# Patient Record
Sex: Male | Born: 1985 | Hispanic: No | Marital: Married | Smoking: Never smoker
Health system: Southern US, Community
[De-identification: ages and names within clinical notes are randomized; demographics above are authoritative.]

---

## 2014-07-16 ENCOUNTER — Emergency Department (HOSPITAL_COMMUNITY): Payer: Medicaid Other

## 2014-07-16 ENCOUNTER — Encounter (HOSPITAL_COMMUNITY): Payer: Self-pay | Admitting: Emergency Medicine

## 2014-07-16 ENCOUNTER — Emergency Department (HOSPITAL_COMMUNITY)
Admission: EM | Admit: 2014-07-16 | Discharge: 2014-07-16 | Disposition: A | Discharge status: Home / Self Care | Payer: Medicaid Other | Attending: Emergency Medicine | Admitting: Emergency Medicine

## 2014-07-16 DIAGNOSIS — Y280XXA Contact with sharp glass, undetermined intent, initial encounter: Secondary | ICD-10-CM | POA: Diagnosis not present

## 2014-07-16 DIAGNOSIS — Y9289 Other specified places as the place of occurrence of the external cause: Secondary | ICD-10-CM | POA: Diagnosis not present

## 2014-07-16 DIAGNOSIS — Y9389 Activity, other specified: Secondary | ICD-10-CM | POA: Insufficient documentation

## 2014-07-16 DIAGNOSIS — S61011A Laceration without foreign body of right thumb without damage to nail, initial encounter: Secondary | ICD-10-CM

## 2014-07-16 MED ORDER — LIDOCAINE HCL 1 % IJ SOLN
10.0000 mL | Freq: Once | INTRAMUSCULAR | Status: DC
  Filled 2014-07-16: qty 10

## 2014-07-16 NOTE — ED Notes (Signed)
Pt. presents with laceration approx. 1" at right thumb sustained from a broken glass lighting fixture this evening with minimal bleeding .

## 2014-07-16 NOTE — ED Provider Notes (Signed)
CSN: [id number]     Arrival date & time 07/16/14  2126 History  This chart was scribed for non-physician practitioner, Irena Cords, PA-C working with Virgel Manifold, MD by Frederich Balding, ED scribe. This patient was seen in room TR05C/TR05C and the patient's care was started at 9:46 PM.    Chief Complaint  Patient presents with  . Laceration   The history is provided by the patient. No language interpreter was used.   HPI Comments: Joel Tyler is a 28 y.o. male who presents to the Emergency Department complaining of a laceration to his right thumb that occurred prior to arrival. States he was fixing a light fixture when a lightbulb broke in his hand. Reports pain around the area with palpation. He does not think any glass is in the wound. Pt is unsure of when his last tetanus was.   History reviewed. No pertinent past medical history. History reviewed. No pertinent past surgical history. History reviewed. No pertinent family history. History  Substance Use Topics  . Smoking status: Never Smoker   . Smokeless tobacco: Not on file  . Alcohol Use: No    Review of Systems All other systems negative except as documented in the HPI. All pertinent positives and negatives as reviewed in the HPI.  Allergies  Review of patient's allergies indicates no known allergies.  Home Medications   Prior to Admission medications   Not on File   BP 121/74  Pulse 79  Temp(Src) 98.3 F (36.8 C) (Oral)  Resp 14  Ht 5\' 8"  (1.727 m)  Wt 133 lb (60.328 kg)  BMI 20.23 kg/m2  SpO2 97%  Physical Exam  Nursing note and vitals reviewed. Constitutional: He is oriented to person, place, and time. He appears well-developed and well-nourished. No distress.  HENT:  Head: Normocephalic and atraumatic.  Cardiovascular: Normal rate.   Pulmonary/Chest: Effort normal. No respiratory distress.  Musculoskeletal: Normal range of motion.       Right hand: He exhibits laceration. He exhibits normal  two-point discrimination and no swelling. Normal sensation noted. Normal strength noted.  Neurological: He is alert and oriented to person, place, and time.  Skin: Skin is warm and dry.  Laceration to right thumb.  Psychiatric: He has a normal mood and affect. His behavior is normal.    ED Course  Procedures (including critical care time)  LACERATION REPAIR PROCEDURE NOTE The patient's identification was confirmed and consent was obtained. This procedure was performed by Delmer Islam, PA-S and Irena Cords, PA-C at 10:28 PM. Site: right thumb Sterile procedures observed Anesthetic used (type and amt): 2 mL 2% lidocaine without epi Suture type/size: 4-0 Prolene Length: 2 cm # of Sutures: 4 Technique: simple interrupted  Complexity: simple Antibx ointment applied  Tetanus ordered Site anesthetized, irrigated with NS, explored without evidence of foreign body, wound well approximated, site covered with dry, sterile dressing.  Patient tolerated procedure well without complications. Instructions for care discussed verbally and patient provided with additional written instructions for homecare and f/u.   DIAGNOSTIC STUDIES: Oxygen Saturation is 97% on RA, normal by my interpretation.    COORDINATION OF CARE: 9:47 PM-Discussed treatment plan which includes updating tetanus and laceration repair with pt at bedside and pt agreed to plan.   Imaging Review Dg Finger Thumb Right  07/16/2014   CLINICAL DATA:  Laceration involving the posterior aspect of the thumb across the IP joint. Concern for collapse radiopaque foreign body. Initial encounter.  EXAM: RIGHT THUMB 2+V  COMPARISON:  None.  FINDINGS: There is an apparent soft tissue laceration involving the dorsal soft tissues about the proximal phalanx of the thumb with scattered foci of subcutaneous emphysema. No displaced fracture or radiopaque foreign body. Joint spaces are preserved. No dislocation.  IMPRESSION: Apparent soft  tissue laceration about the dorsal soft tissues of the proximal phalanx of the thumb without associated fracture or radiopaque foreign body.   Electronically Signed   By: Sandi Mariscal M.D.   On: 07/16/2014 22:37     I personally performed the services described in this documentation, which was scribed in my presence. The recorded information has been reviewed and is accurate.    Brent General, PA-C 07/16/14 2320

## 2014-07-16 NOTE — Discharge Instructions (Signed)
Return here as needed. Have sutures out 14 days. Keep area clean and dry.

## 2014-07-16 NOTE — ED Notes (Signed)
Pt working on light at home. Broke glass and cut finger

## 2014-07-19 NOTE — ED Provider Notes (Signed)
Medical screening examination/treatment/procedure(s) were performed by non-physician practitioner and as supervising physician I was immediately available for consultation/collaboration.   EKG Interpretation None       Virgel Manifold, MD 07/19/14 1902

## 2014-12-16 ENCOUNTER — Emergency Department (HOSPITAL_COMMUNITY): Payer: Medicaid Other

## 2014-12-16 ENCOUNTER — Encounter (HOSPITAL_COMMUNITY): Payer: Self-pay | Admitting: Emergency Medicine

## 2014-12-16 ENCOUNTER — Emergency Department (HOSPITAL_COMMUNITY)
Admission: EM | Admit: 2014-12-16 | Discharge: 2014-12-16 | Disposition: A | Discharge status: Home / Self Care | Payer: Medicaid Other | Source: Home / Self Care

## 2014-12-16 ENCOUNTER — Inpatient Hospital Stay (HOSPITAL_COMMUNITY)
Admission: EM | Admit: 2014-12-16 | Discharge: 2014-12-20 | DRG: 395 | Disposition: A | Discharge status: Home / Self Care | Payer: Medicaid Other | Attending: Surgery | Admitting: Surgery

## 2014-12-16 DIAGNOSIS — D1803 Hemangioma of intra-abdominal structures: Secondary | ICD-10-CM | POA: Diagnosis present

## 2014-12-16 DIAGNOSIS — K8050 Calculus of bile duct without cholangitis or cholecystitis without obstruction: Secondary | ICD-10-CM

## 2014-12-16 DIAGNOSIS — K8020 Calculus of gallbladder without cholecystitis without obstruction: Secondary | ICD-10-CM | POA: Diagnosis present

## 2014-12-16 DIAGNOSIS — K550 Acute vascular disorders of intestine: Principal | ICD-10-CM | POA: Diagnosis present

## 2014-12-16 DIAGNOSIS — R1013 Epigastric pain: Secondary | ICD-10-CM | POA: Insufficient documentation

## 2014-12-16 DIAGNOSIS — K449 Diaphragmatic hernia without obstruction or gangrene: Secondary | ICD-10-CM | POA: Diagnosis present

## 2014-12-16 DIAGNOSIS — R1011 Right upper quadrant pain: Secondary | ICD-10-CM | POA: Insufficient documentation

## 2014-12-16 DIAGNOSIS — K654 Sclerosing mesenteritis: Secondary | ICD-10-CM | POA: Diagnosis present

## 2014-12-16 LAB — COMPREHENSIVE METABOLIC PANEL
ALBUMIN: 3.9 g/dL (ref 3.5–5.2)
ALT: 17 U/L (ref 0–53)
ALT: 16 U/L (ref 0–53)
AST: 21 U/L (ref 0–37)
AST: 20 U/L (ref 0–37)
Albumin: 3.9 g/dL (ref 3.5–5.2)
Alkaline Phosphatase: 69 U/L (ref 39–117)
Alkaline Phosphatase: 69 U/L (ref 39–117)
Anion gap: 4 — ABNORMAL LOW (ref 5–15)
Anion gap: 8 (ref 5–15)
BUN: 11 mg/dL (ref 6–23)
BUN: 14 mg/dL (ref 6–23)
CALCIUM: 8.8 mg/dL (ref 8.4–10.5)
CHLORIDE: 104 mmol/L (ref 96–112)
CO2: 30 mmol/L (ref 19–32)
CO2: 25 mmol/L (ref 19–32)
CREATININE: 0.75 mg/dL (ref 0.50–1.35)
Calcium: 9.2 mg/dL (ref 8.4–10.5)
Chloride: 104 mmol/L (ref 96–112)
Creatinine, Ser: 0.75 mg/dL (ref 0.50–1.35)
GFR calc Af Amer: >90 mL/min (ref >90)
GFR calc Af Amer: >90 mL/min (ref >90)
GFR calc non Af Amer: >90 mL/min (ref >90)
GFR calc non Af Amer: >90 mL/min (ref >90)
Glucose, Bld: 94 mg/dL (ref 70–99)
Glucose, Bld: 103 mg/dL — ABNORMAL HIGH (ref 70–99)
POTASSIUM: 3.8 mmol/L (ref 3.5–5.1)
Potassium: 4 mmol/L (ref 3.5–5.1)
SODIUM: 138 mmol/L (ref 135–145)
Sodium: 137 mmol/L (ref 135–145)
Total Bilirubin: 0.6 mg/dL (ref 0.3–1.2)
Total Bilirubin: 0.6 mg/dL (ref 0.3–1.2)
Total Protein: 7.1 g/dL (ref 6.0–8.3)
Total Protein: 7.1 g/dL (ref 6.0–8.3)

## 2014-12-16 LAB — CBC WITH DIFFERENTIAL/PLATELET
BASOS PCT: 0 % (ref 0–1)
Basophils Absolute: 0 10*3/uL (ref 0.0–0.1)
Basophils Absolute: 0 10*3/uL (ref 0.0–0.1)
Basophils Relative: 0 % (ref 0–1)
Eosinophils Absolute: 0.2 10*3/uL (ref 0.0–0.7)
Eosinophils Absolute: 0.1 10*3/uL (ref 0.0–0.7)
Eosinophils Relative: 3 % (ref 0–5)
Eosinophils Relative: 1 % (ref 0–5)
HCT: 42.3 % (ref 39.0–52.0)
HCT: 41.4 % (ref 39.0–52.0)
Hemoglobin: 14.9 g/dL (ref 13.0–17.0)
Hemoglobin: 14.6 g/dL (ref 13.0–17.0)
LYMPHS PCT: 39 % (ref 12–46)
Lymphocytes Relative: 18 % (ref 12–46)
Lymphs Abs: 1.9 10*3/uL (ref 0.7–4.0)
Lymphs Abs: 1.2 10*3/uL (ref 0.7–4.0)
MCH: 29.4 pg (ref 26.0–34.0)
MCH: 29.3 pg (ref 26.0–34.0)
MCHC: 35.2 g/dL (ref 30.0–36.0)
MCHC: 35.3 g/dL (ref 30.0–36.0)
MCV: 83.4 fL (ref 78.0–100.0)
MCV: 83.1 fL (ref 78.0–100.0)
MONO ABS: 0.4 10*3/uL (ref 0.1–1.0)
Monocytes Absolute: 0.3 10*3/uL (ref 0.1–1.0)
Monocytes Relative: 8 % (ref 3–12)
Monocytes Relative: 5 % (ref 3–12)
NEUTROS PCT: 50 % (ref 43–77)
Neutro Abs: 2.4 10*3/uL (ref 1.7–7.7)
Neutro Abs: 4.8 10*3/uL (ref 1.7–7.7)
Neutrophils Relative %: 76 % (ref 43–77)
Platelets: 192 10*3/uL (ref 150–400)
Platelets: 187 10*3/uL (ref 150–400)
RBC: 5.07 MIL/uL (ref 4.22–5.81)
RBC: 4.98 MIL/uL (ref 4.22–5.81)
RDW: 12.5 % (ref 11.5–15.5)
RDW: 12.5 % (ref 11.5–15.5)
WBC: 4.8 10*3/uL (ref 4.0–10.5)
WBC: 6.4 10*3/uL (ref 4.0–10.5)

## 2014-12-16 LAB — I-STAT CHEM 8, ED
BUN: 15 mg/dL (ref 6–23)
Calcium, Ion: 1.2 mmol/L (ref 1.12–1.23)
Chloride: 102 mmol/L (ref 96–112)
Creatinine, Ser: 0.8 mg/dL (ref 0.50–1.35)
Glucose, Bld: 98 mg/dL (ref 70–99)
HCT: 46 % (ref 39.0–52.0)
Hemoglobin: 15.6 g/dL (ref 13.0–17.0)
Potassium: 4 mmol/L (ref 3.5–5.1)
SODIUM: 140 mmol/L (ref 135–145)
TCO2: 23 mmol/L (ref 0–100)

## 2014-12-16 LAB — LIPASE, BLOOD
LIPASE: 30 U/L (ref 11–59)
Lipase: 23 U/L (ref 11–59)

## 2014-12-16 MED ORDER — FENTANYL CITRATE 0.05 MG/ML IJ SOLN
50.0000 ug | Freq: Once | INTRAMUSCULAR | Status: AC
  Administered 2014-12-16: 50 ug via INTRAVENOUS
  Filled 2014-12-16: qty 2

## 2014-12-16 MED ORDER — HYDROMORPHONE HCL 1 MG/ML IJ SOLN
1.0000 mg | Freq: Once | INTRAMUSCULAR | Status: AC
  Administered 2014-12-16: 1 mg via INTRAVENOUS
  Filled 2014-12-16: qty 1

## 2014-12-16 MED ORDER — OMEPRAZOLE 20 MG PO CPDR: 20.0000 mg | DELAYED_RELEASE_CAPSULE | Freq: Every day | ORAL | Status: AC

## 2014-12-16 MED ORDER — IOHEXOL 300 MG/ML  SOLN
25.0000 mL | Freq: Once | INTRAMUSCULAR | Status: AC | PRN
  Administered 2014-12-16: 25 mL via ORAL

## 2014-12-16 MED ORDER — ONDANSETRON HCL 4 MG/2ML IJ SOLN
4.0000 mg | Freq: Once | INTRAMUSCULAR | Status: AC
  Administered 2014-12-16: 4 mg via INTRAVENOUS
  Filled 2014-12-16: qty 2

## 2014-12-16 NOTE — ED Provider Notes (Signed)
CSN: [id number]     Arrival date & time 12/16/14  4818 History   First MD Initiated Contact with Patient 12/16/14 [phone]     Chief Complaint  Patient presents with  . Abdominal Pain     (Consider location/radiation/quality/duration/timing/severity/associated sxs/prior Treatment) Patient is a 29 y.o. male presenting with abdominal pain. The history is provided by the patient.  Abdominal Pain Associated symptoms: no chest pain, no diarrhea, no nausea, no shortness of breath and no vomiting    patient developed pain in his epigastric to right upper abdomen yesterday. It is constant. No change in eating. It is worse with palpation and deep breathing. No nausea vomiting. No diarrhea. No Constipation. He has not had pains like this before.. It is sharp. It does not radiate. States he does not drink alcohol. No recent NSAID use. No fevers. History reviewed. No pertinent past medical history. History reviewed. No pertinent past surgical history. No family history on file. History  Substance Use Topics  . Smoking status: Never Smoker   . Smokeless tobacco: Not on file  . Alcohol Use: No    Review of Systems  Constitutional: Negative for activity change and appetite change.  Eyes: Negative for pain.  Respiratory: Negative for chest tightness and shortness of breath.   Cardiovascular: Negative for chest pain and leg swelling.  Gastrointestinal: Positive for abdominal pain. Negative for nausea, vomiting and diarrhea.  Genitourinary: Negative for flank pain.  Musculoskeletal: Negative for back pain and neck stiffness.  Skin: Negative for rash.  Neurological: Negative for weakness, numbness and headaches.  Psychiatric/Behavioral: Negative for behavioral problems.      Allergies  Review of patient's allergies indicates no known allergies.  Home Medications   Prior to Admission medications   Not on File   BP 127/81 mmHg  Pulse 84  Temp(Src) 98.2 F (36.8 C) (Oral)  Resp 18  Wt 132  lb 4.4 oz (60 kg)  SpO2 100% Physical Exam  Constitutional: He is oriented to person, place, and time. He appears well-developed and well-nourished.  Patient is slender  HENT:  Head: Normocephalic and atraumatic.  Eyes: EOM are normal. Pupils are equal, round, and reactive to light.  Neck: Normal range of motion. Neck supple.  Cardiovascular: Normal rate, regular rhythm and normal heart sounds.   No murmur heard. Pulmonary/Chest: Effort normal and breath sounds normal.  Abdominal: Soft. Bowel sounds are normal. He exhibits no distension and no mass. There is tenderness. There is no rebound and no guarding.  Tenderness to epigastric to right upper quadrant area. No rebound or guarding.  Musculoskeletal: Normal range of motion. He exhibits no edema.  Neurological: He is alert and oriented to person, place, and time. No cranial nerve deficit.  Skin: Skin is warm and dry.  Psychiatric: He has a normal mood and affect.  Nursing note and vitals reviewed.   ED Course  Procedures (including critical care time) Labs Review Labs Reviewed  COMPREHENSIVE METABOLIC PANEL  LIPASE, BLOOD  CBC WITH DIFFERENTIAL/PLATELET    Imaging Review No results found.   EKG Interpretation None      MDM   Final diagnoses:  None    Patient with epigastric abdominal pain. Tenderness is slightly more medial than gallbladder. Lab work is overall reassuring. Ultrasound showed possible sludge in the gallbladder neck without other signs cholecystitis. I'll treat with Prilosec and will have follow-up with general surgery as needed. Doubt acute cholecystitis at this time. Has been given instructions on what to watch out for  for returning to the ER.    Davonna Belling, MD 12/16/14 1052

## 2014-12-16 NOTE — ED Notes (Signed)
Patient transported to Ultrasound 

## 2014-12-16 NOTE — Discharge Instructions (Signed)
Biliary Colic  °Biliary colic is a steady or irregular pain in the upper abdomen. It is usually under the right side of the rib cage. It happens when gallstones interfere with the normal flow of bile from the gallbladder. Bile is a liquid that helps to digest fats. Bile is made in the liver and stored in the gallbladder. When you eat a meal, bile passes from the gallbladder through the cystic duct and the common bile duct into the small intestine. There, it mixes with partially digested food. If a gallstone blocks either of these ducts, the normal flow of bile is blocked. The muscle cells in the bile duct contract forcefully to try to move the stone. This causes the pain of biliary colic.  °SYMPTOMS  °· A person with biliary colic usually complains of pain in the upper abdomen. This pain can be: °· In the center of the upper abdomen just below the breastbone. °· In the upper-right part of the abdomen, near the gallbladder and liver. °· Spread back toward the right shoulder blade. °· Nausea and vomiting. °· The pain usually occurs after eating. °· Biliary colic is usually triggered by the digestive system's demand for bile. The demand for bile is high after fatty meals. Symptoms can also occur when a person who has been fasting suddenly eats a very large meal. Most episodes of biliary colic pass after 1 to 5 hours. After the most intense pain passes, your abdomen may continue to ache mildly for about 24 hours. °DIAGNOSIS  °After you describe your symptoms, your caregiver will perform a physical exam. He or she will pay attention to the upper right portion of your belly (abdomen). This is the area of your liver and gallbladder. An ultrasound will help your caregiver look for gallstones. Specialized scans of the gallbladder may also be done. Blood tests may be done, especially if you have fever or if your pain persists. °PREVENTION  °Biliary colic can be prevented by controlling the risk factors for gallstones. Some of  these risk factors, such as heredity, increasing age, and pregnancy are a normal part of life. Obesity and a high-fat diet are risk factors you can change through a healthy lifestyle. Women going through menopause who take hormone replacement therapy (estrogen) are also more likely to develop biliary colic. °TREATMENT  °· Pain medication may be prescribed. °· You may be encouraged to eat a fat-free diet. °· If the first episode of biliary colic is severe, or episodes of colic keep retuning, surgery to remove the gallbladder (cholecystectomy) is usually recommended. This procedure can be done through small incisions using an instrument called a laparoscope. The procedure often requires a brief stay in the hospital. Some people can leave the hospital the same day. It is the most widely used treatment in people troubled by painful gallstones. It is effective and safe, with no complications in more than 90% of cases. °· If surgery cannot be done, medication that dissolves gallstones may be used. This medication is expensive and can take months or years to work. Only small stones will dissolve. °· Rarely, medication to dissolve gallstones is combined with a procedure called shock-wave lithotripsy. This procedure uses carefully aimed shock waves to break up gallstones. In many people treated with this procedure, gallstones form again within a few years. °PROGNOSIS  °If gallstones block your cystic duct or common bile duct, you are at risk for repeated episodes of biliary colic. There is also a 25% chance that you will develop   a gallbladder infection(acute cholecystitis), or some other complication of gallstones within 10 to 20 years. If you have surgery, schedule it at a time that is convenient for you and at a time when you are not sick. HOME CARE INSTRUCTIONS   Drink plenty of clear fluids.  Avoid fatty, greasy or fried foods, or any foods that make your pain worse.  Take medications as directed. SEEK MEDICAL  CARE IF:   You develop a fever over 100.5 F (38.1 C).  Your pain gets worse over time.  You develop nausea that prevents you from eating and drinking.  You develop vomiting. SEEK IMMEDIATE MEDICAL CARE IF:   You have continuous or severe belly (abdominal) pain which is not relieved with medications.  You develop nausea and vomiting which is not relieved with medications.  You have symptoms of biliary colic and you suddenly develop a fever and shaking chills. This may signal cholecystitis. Call your caregiver immediately.  You develop a yellow color to your skin or the white part of your eyes (jaundice). Document Released: 02/23/2006 Document Revised: 12/15/2011 Document Reviewed: 05/04/2008 [facility] Patient Information 2015 [place], [place]. This information is not intended to replace advice given to you by your health care provider. Make sure you discuss any questions you have with your health care provider.  Abdominal Pain Many things can cause abdominal pain. Usually, abdominal pain is not caused by a disease and will improve without treatment. It can often be observed and treated at home. Your health care provider will do a physical exam and possibly order blood tests and X-rays to help determine the seriousness of your pain. However, in many cases, more time must pass before a clear cause of the pain can be found. Before that point, your health care provider may not know if you need more testing or further treatment. HOME CARE INSTRUCTIONS  Monitor your abdominal pain for any changes. The following actions may help to alleviate any discomfort you are experiencing:  Only take over-the-counter or prescription medicines as directed by your health care provider.  Do not take laxatives unless directed to do so by your health care provider.  Try a clear liquid diet (broth, tea, or water) as directed by your health care provider. Slowly move to a bland diet as tolerated. SEEK MEDICAL  CARE IF:  You have unexplained abdominal pain.  You have abdominal pain associated with nausea or diarrhea.  You have pain when you urinate or have a bowel movement.  You experience abdominal pain that wakes you in the night.  You have abdominal pain that is worsened or improved by eating food.  You have abdominal pain that is worsened with eating fatty foods.  You have a fever. SEEK IMMEDIATE MEDICAL CARE IF:   Your pain does not go away within 2 hours.  You keep throwing up (vomiting).  Your pain is felt only in portions of the abdomen, such as the right side or the left lower portion of the abdomen.  You pass bloody or black tarry stools. MAKE SURE YOU:  Understand these instructions.   Will watch your condition.   Will get help right away if you are not doing well or get worse.  Document Released: 07/02/2005 Document Revised: 09/27/2013 Document Reviewed: 06/01/2013 [facility] Patient Information 2015 [place], [place]. This information is not intended to replace advice given to you by your health care provider. Make sure you discuss any questions you have with your health care provider.

## 2014-12-16 NOTE — ED Provider Notes (Signed)
CSN: [id number]     Arrival date & time 12/16/14  1628 History   First MD Initiated Contact with Patient 12/16/14 2100     Chief Complaint  Patient presents with  . Abdominal Pain     (Consider location/radiation/quality/duration/timing/severity/associated sxs/prior Treatment) HPI   The patient is a 29 y/o [place] man who presents to the emergency department for right-sided abdominal pain after being evaluated here this morning. He states that after his work-up this morning, he was discharged with a prescription for prilosec. He took this with no relief. He states that since then, his pain has significantly worsened, becoming a constant 8/10 in severity instead of intermittently as it was this morning. The pain is located in the midline to RUQ and also radiates downward and to his back. He notes no change with eating and was able to tolerate lunch today. It is worsened by deep breathing and movement. He denies fevers, chills, nausea, vomiting, diarrhea, chest pain, numbness, tingling. He has developed a headache since this morning. He says it is mild and dull, located in the front of his head. He denies photophobia, phonophobia, and sinus congestion. He denies alcohol and tobacco use.   His lab work this morning included a CBC, lipase, and CMP all WNL. A gallbladder ultrasound showed cholelithiasis without acute cholecystitis and 2 possible benign hemangiomas of the liver.    History reviewed. No pertinent past medical history. History reviewed. No pertinent past surgical history. No family history on file. History  Substance Use Topics  . Smoking status: Never Smoker   . Smokeless tobacco: Not on file  . Alcohol Use: No    Review of Systems  All other systems negative except as documented in the HPI. All pertinent positives and negatives as reviewed in the HPI.   Allergies  Review of patient's allergies indicates no known allergies.  Home Medications   Prior to Admission  medications   Medication Sig Start Date End Date Taking? Authorizing Provider  omeprazole (PRILOSEC) 20 MG capsule Take 1 capsule (20 mg total) by mouth daily. 12/16/14   Davonna Belling, MD   BP 118/75 mmHg  Pulse 83  Temp(Src) 99 F (37.2 C) (Oral)  Resp 16  Ht 5\' 8"  (1.727 m)  Wt 135 lb (61.236 kg)  BMI 20.53 kg/m2  SpO2 97% Physical Exam  Constitutional: He is oriented to person, place, and time. He appears well-nourished. He appears distressed.  HENT:  Head: Normocephalic and atraumatic.  Eyes: EOM are normal. Pupils are equal, round, and reactive to light.  Neck: Normal range of motion. Neck supple.  Cardiovascular: Normal rate, regular rhythm, normal heart sounds and intact distal pulses.  Exam reveals no gallop and no friction rub.   No murmur heard. Pulmonary/Chest: Effort normal and breath sounds normal.  Difficulty taking a deep breath due to abdominal pain  Abdominal: Normal appearance. He exhibits no distension. There is tenderness in the right upper quadrant, right lower quadrant and epigastric area. There is guarding. There is no rebound.  Musculoskeletal: He exhibits no edema.  Lymphadenopathy:    He has no cervical adenopathy.  Neurological: He is alert and oriented to person, place, and time.  Skin: Skin is warm and dry.  Psychiatric: He has a normal mood and affect.  Nursing note and vitals reviewed.   ED Course  Procedures (including critical care time) Labs Review Labs Reviewed  I-STAT CHEM 8, ED    Imaging Review US Abdomen Complete  12/16/2014   CLINICAL DATA:  29 year old male with a history of abdominal pain for 1 day  EXAM: ULTRASOUND ABDOMEN COMPLETE  COMPARISON:  None.  FINDINGS: Gallbladder: No evidence of gallbladder wall thickening. Negative sonographic Murphy's sign. Heterogeneously echoic material within the neck of the gallbladder with posterior acoustic shadowing.  Common bile duct: Diameter: 2.4 mm  Liver: Relatively homogeneous appearance  of liver parenchyma. Focal uniformly hyperechoic lesions identified.  Left lobe:  1.9 cm x 1.4 cm x 1.9 cm, segment 4B.  Left lobe:  1.5 cm x 1.1 cm x 1.2 cm, segment 4B.  IVC: No abnormality visualized.  Pancreas: Visualized portion unremarkable.  Spleen: Size and appearance within normal limits.  Right Kidney: Length: 9.6 cm. Echogenicity within normal limits. No mass or hydronephrosis visualized.  Left Kidney: Length: 9.3 cm. Echogenicity within normal limits. No mass or hydronephrosis visualized.  Abdominal aorta: No aneurysm visualized.  Other findings: None.  IMPRESSION: Evidence of cholelithiasis without sonographic evidence of acute cholecystitis.  There are 2 separate small hyperechoic lesions of the liver. In a patient of this age without risk factors for carcinoma, most likely entity would be benign hemangioma. If the patient has risk factors for development of cirrhosis/HCC, or otherwise a known primary, further evaluation with MRI would be indicated.  Signed,  Dulcy Fanny. Earleen Newport, DO  [facility]  [facility]   Electronically Signed   By: Corrie Mckusick D.O.   On: 12/16/2014 09:46   I spoke with general surgery  Dr. Redmond Pulling about the patient.  He will come in to evaluate him for continued significant pain and guarding in the right upper quadrant region.  This was on reexamination after pain medications and CT scan.  The patient's significant abdominal discomfort, does not correlate with his laboratory testing and CT scan the pain medication did not seem to help significantly   MDM   Final diagnoses:  None       Dalia Heading, PA-C 12/17/14 0148  Alfonzo Beers, MD 12/17/14 [phone]

## 2014-12-16 NOTE — ED Notes (Signed)
Pt c/o right lower quadrant abdominal pain onset yesterday. Pt seen here earlier for same, but returned due to pain. Pt tried prescribed medication without relief.

## 2014-12-16 NOTE — ED Notes (Signed)
Pt reports sudden onset pain in LUQ radiating to back, constant 8/10 pain.  Denies N/V/D, denies SOB.  Resp e/u, NAD noted at this time.

## 2014-12-16 NOTE — ED Notes (Signed)
CT notified that pt finished his contrast.

## 2014-12-17 ENCOUNTER — Emergency Department (HOSPITAL_COMMUNITY): Payer: Medicaid Other

## 2014-12-17 ENCOUNTER — Encounter (HOSPITAL_COMMUNITY): Payer: Self-pay | Admitting: Radiology

## 2014-12-17 ENCOUNTER — Inpatient Hospital Stay (HOSPITAL_COMMUNITY): Payer: Medicaid Other

## 2014-12-17 DIAGNOSIS — K8020 Calculus of gallbladder without cholecystitis without obstruction: Secondary | ICD-10-CM | POA: Diagnosis present

## 2014-12-17 DIAGNOSIS — R1013 Epigastric pain: Secondary | ICD-10-CM

## 2014-12-17 DIAGNOSIS — R1011 Right upper quadrant pain: Secondary | ICD-10-CM | POA: Diagnosis present

## 2014-12-17 DIAGNOSIS — K550 Acute vascular disorders of intestine: Secondary | ICD-10-CM | POA: Diagnosis present

## 2014-12-17 DIAGNOSIS — K449 Diaphragmatic hernia without obstruction or gangrene: Secondary | ICD-10-CM | POA: Diagnosis present

## 2014-12-17 DIAGNOSIS — D1803 Hemangioma of intra-abdominal structures: Secondary | ICD-10-CM | POA: Diagnosis present

## 2014-12-17 LAB — I-STAT CG4 LACTIC ACID, ED: Lactic Acid, Venous: 0.43 mmol/L — ABNORMAL LOW (ref 0.5–2.0)

## 2014-12-17 MED ORDER — HYDROMORPHONE HCL 1 MG/ML IJ SOLN
1.0000 mg | Freq: Once | INTRAMUSCULAR | Status: AC
  Administered 2014-12-17: 1 mg via INTRAVENOUS
  Filled 2014-12-17: qty 1

## 2014-12-17 MED ORDER — HEPARIN SODIUM (PORCINE) 5000 UNIT/ML IJ SOLN
5000.0000 [IU] | Freq: Three times a day (TID) | INTRAMUSCULAR | Status: DC
  Administered 2014-12-17 – 2014-12-20 (×7): 5000 [IU] via SUBCUTANEOUS
  Filled 2014-12-17 (×7): qty 1

## 2014-12-17 MED ORDER — SINCALIDE 5 MCG IJ SOLR
0.0200 ug/kg | Freq: Once | INTRAMUSCULAR | Status: AC
  Administered 2014-12-17: 1.2 ug via INTRAVENOUS

## 2014-12-17 MED ORDER — IOHEXOL 300 MG/ML  SOLN
100.0000 mL | Freq: Once | INTRAMUSCULAR | Status: AC | PRN
  Administered 2014-12-17: 100 mL via INTRAVENOUS

## 2014-12-17 MED ORDER — ONDANSETRON HCL 4 MG PO TABS: 4.0000 mg | ORAL_TABLET | Freq: Four times a day (QID) | ORAL | Status: DC | PRN

## 2014-12-17 MED ORDER — SINCALIDE 5 MCG IJ SOLR
INTRAMUSCULAR | Status: AC
  Filled 2014-12-17: qty 5

## 2014-12-17 MED ORDER — ONDANSETRON HCL 4 MG/2ML IJ SOLN
4.0000 mg | Freq: Once | INTRAMUSCULAR | Status: AC
  Administered 2014-12-17: 4 mg via INTRAVENOUS
  Filled 2014-12-17: qty 2

## 2014-12-17 MED ORDER — SODIUM CHLORIDE 0.9 % IV BOLUS (SEPSIS)
1000.0000 mL | Freq: Once | INTRAVENOUS | Status: AC
  Administered 2014-12-17: 1000 mL via INTRAVENOUS

## 2014-12-17 MED ORDER — TECHNETIUM TC 99M MEBROFENIN IV KIT
5.0000 | PACK | Freq: Once | INTRAVENOUS | Status: AC | PRN
  Administered 2014-12-17: 5 via INTRAVENOUS

## 2014-12-17 MED ORDER — ONDANSETRON HCL 4 MG/2ML IJ SOLN: 4.0000 mg | Freq: Four times a day (QID) | INTRAMUSCULAR | Status: DC | PRN

## 2014-12-17 MED ORDER — PANTOPRAZOLE SODIUM 40 MG PO TBEC
40.0000 mg | DELAYED_RELEASE_TABLET | Freq: Every day | ORAL | Status: DC
  Administered 2014-12-17 – 2014-12-20 (×3): 40 mg via ORAL
  Filled 2014-12-17 (×3): qty 1

## 2014-12-17 MED ORDER — HYDROMORPHONE HCL 1 MG/ML IJ SOLN
1.0000 mg | INTRAMUSCULAR | Status: DC | PRN
  Administered 2014-12-17 – 2014-12-18 (×5): 1 mg via INTRAVENOUS
  Filled 2014-12-17 (×6): qty 1

## 2014-12-17 MED ORDER — STERILE WATER FOR INJECTION IJ SOLN
INTRAMUSCULAR | Status: AC
  Filled 2014-12-17: qty 10

## 2014-12-17 NOTE — Progress Notes (Signed)
PATIENT DETAILS Name: Joel Tyler Age: 29 y.o. Sex: male Date of Birth: Aug 14, 1986 Admit Date: 12/16/2014 Admitting Physician Etta Quill, DO PCP:No PCP Per Patient  Subjective: Still with RUQ pain  Assessment/Plan: Active Problems:   Right upper quadrant abdominal pain: Admitted with persistent right upper quadrant abdominal pain. Although CT abdomen/ultrasound abdomen shows cholelithiasis, HIDA scan negative. Continue supportive measures, will consult gastroenterology for EGD. Start clear liquids and advance as tolerated.  Disposition: Remain inpatient  Antibiotics:  None   Anti-infectives    None      DVT Prophylaxis: Prophylactic Heparin   Code Status: Full code  Family Communication Brother at bedside  Procedures:  None  CONSULTS:  GI and general surgery  MEDICATIONS: Scheduled Meds: . heparin  5,000 Units Subcutaneous 3 times per day  . sincalide      . sterile water (preservative free)       Continuous Infusions:  PRN Meds:.HYDROmorphone (DILAUDID) injection, ondansetron **OR** ondansetron (ZOFRAN) IV    PHYSICAL EXAM: Vital signs in last 24 hours: Filed Vitals:   12/17/14 0400 12/17/14 0415 12/17/14 0430 12/17/14 0613  BP: 111/61 111/61 118/67 116/74  Pulse: 67 80 70 70  Temp:    98.6 F (37 C)  TempSrc:    Oral  Resp: 11 16 16 16   Height:      Weight:    60.2 kg (132 lb 11.5 oz)  SpO2: 99% 100% 98% 100%    Weight change:  Filed Weights   12/16/14 1710 12/17/14 0613  Weight: 61.236 kg (135 lb) 60.2 kg (132 lb 11.5 oz)   Body mass index is 20.18 kg/(m^2).   Gen Exam: Awake and alert with clear speech.   Neck: Supple, No JVD.   Chest: B/L Clear.   CVS: S1 S2 Regular, no murmurs.  Abdomen: soft, BS +, RUQ tender, non distended.  Extremities: no edema, lower extremities warm to touch. Neurologic: Non Focal.   Skin: No Rash.   Wounds: N/A.   Intake/Output from previous day:  Intake/Output Summary (Last 24 hours)  at 12/17/14 1238 Last data filed at 12/17/14 0800  Gross per 24 hour  Intake      0 ml  Output      0 ml  Net      0 ml     LAB RESULTS: CBC  Recent Labs Lab 12/16/14 0718 12/16/14 1719 12/16/14 2130  WBC 4.8  --  6.4  HGB 14.9 15.6 14.6  HCT 42.3 46.0 41.4  PLT 192  --  187  MCV 83.4  --  83.1  MCH 29.4  --  29.3  MCHC 35.2  --  35.3  RDW 12.5  --  12.5  LYMPHSABS 1.9  --  1.2  MONOABS 0.4  --  0.3  EOSABS 0.2  --  0.1  BASOSABS 0.0  --  0.0    Chemistries   Recent Labs Lab 12/16/14 0718 12/16/14 1719 12/16/14 2130  NA 138 140 137  K 3.8 4.0 4.0  CL 104 102 104  CO2 30  --  25  GLUCOSE 94 98 103*  BUN 11 15 14   CREATININE 0.75 0.80 0.75  CALCIUM 8.8  --  9.2    CBG: No results for input(s): GLUCAP in the last 168 hours.  GFR Estimated Creatinine Clearance: 116 mL/min (by C-G formula based on Cr of 0.75).  Coagulation profile No results for input(s): INR, PROTIME in the last 168 hours.  Cardiac Enzymes No results for input(s): CKMB, TROPONINI, MYOGLOBIN in the last 168 hours.  Invalid input(s): CK  Invalid input(s): POCBNP No results for input(s): DDIMER in the last 72 hours. No results for input(s): HGBA1C in the last 72 hours. No results for input(s): CHOL, HDL, LDLCALC, TRIG, CHOLHDL, LDLDIRECT in the last 72 hours. No results for input(s): TSH, T4TOTAL, T3FREE, THYROIDAB in the last 72 hours.  Invalid input(s): FREET3 No results for input(s): VITAMINB12, FOLATE, FERRITIN, TIBC, IRON, RETICCTPCT in the last 72 hours.  Recent Labs  12/16/14 0718 12/16/14 2130  LIPASE 30 23    Urine Studies No results for input(s): UHGB, CRYS in the last 72 hours.  Invalid input(s): UACOL, UAPR, USPG, UPH, UTP, UGL, UKET, UBIL, UNIT, UROB, ULEU, UEPI, UWBC, URBC, UBAC, CAST, UCOM, BILUA  MICROBIOLOGY: No results found for this or any previous visit (from the past 240 hour(s)).  RADIOLOGY STUDIES/RESULTS: Nm Hepatobiliary Liver Func  12/17/2014    CLINICAL DATA:  29 year old male with history of abdominal pain and cholelithiasis noted on recent CT examination.  EXAM: NUCLEAR MEDICINE HEPATOBILIARY IMAGING WITH GALLBLADDER EF  TECHNIQUE: Sequential images of the abdomen were obtained out to 60 minutes following intravenous administration of radiopharmaceutical. After slow intravenous infusion of 1.2 micrograms Cholecystokinin, gallbladder ejection fraction was determined.  RADIOPHARMACEUTICALS:  5 [employer] [id number] Choletec  COMPARISON:  No priors.  FINDINGS: Rapid uptake of radiotracer throughout the hepatic parenchyma. Gallbladder activity was noted by 10 min. At 60 min, most of the activity was within the gallbladder. Following administration of cholecystokinin, activity rapidly entered the small bowel. Gallbladder ejection fraction was 68%. At 45 min, normal ejection fraction is greater than 40%.  IMPRESSION: 1. Normal study.  No findings to suggest acute cholecystitis.   Electronically Signed   By: Vinnie Langton M.D.   On: 12/17/2014 11:45   US Abdomen Complete  12/16/2014   CLINICAL DATA:  29 year old male with a history of abdominal pain for 1 day  EXAM: ULTRASOUND ABDOMEN COMPLETE  COMPARISON:  None.  FINDINGS: Gallbladder: No evidence of gallbladder wall thickening. Negative sonographic Murphy's sign. Heterogeneously echoic material within the neck of the gallbladder with posterior acoustic shadowing.  Common bile duct: Diameter: 2.4 mm  Liver: Relatively homogeneous appearance of liver parenchyma. Focal uniformly hyperechoic lesions identified.  Left lobe:  1.9 cm x 1.4 cm x 1.9 cm, segment 4B.  Left lobe:  1.5 cm x 1.1 cm x 1.2 cm, segment 4B.  IVC: No abnormality visualized.  Pancreas: Visualized portion unremarkable.  Spleen: Size and appearance within normal limits.  Right Kidney: Length: 9.6 cm. Echogenicity within normal limits. No mass or hydronephrosis visualized.  Left Kidney: Length: 9.3 cm. Echogenicity within normal limits. No mass  or hydronephrosis visualized.  Abdominal aorta: No aneurysm visualized.  Other findings: None.  IMPRESSION: Evidence of cholelithiasis without sonographic evidence of acute cholecystitis.  There are 2 separate small hyperechoic lesions of the liver. In a patient of this age without risk factors for carcinoma, most likely entity would be benign hemangioma. If the patient has risk factors for development of cirrhosis/HCC, or otherwise a known primary, further evaluation with MRI would be indicated.  Signed,  Dulcy Fanny. Earleen Newport, DO  [facility]  [facility] [address] [facility]   Electronically Signed   By: Corrie Mckusick D.O.   On: 12/16/2014 09:46   Ct Abdomen Pelvis W Contrast  12/17/2014   CLINICAL DATA:  Right mid and lower quadrant abdominal pain.  EXAM: CT ABDOMEN  AND PELVIS WITH CONTRAST  TECHNIQUE: Multidetector CT imaging of the abdomen and pelvis was performed using the standard protocol following bolus administration of intravenous contrast.  CONTRAST:  163mL OMNIPAQUE IOHEXOL 300 MG/ML  SOLN  COMPARISON:  Right upper quadrant ultrasound performed yesterday.  FINDINGS: The included lung bases are clear.  Two hypodense lesions in the left hepatic lobe measure 1.6 and 1.0 cm with some peripheral puddling of contrast, consistent with hemangiomas previous correspond to the hyperechoic lesion on ultrasound. Gallbladder is physiologically distended, gallstone tentatively identified in the gallbladder neck. No pericholecystic fluid or wall thickening.  The appendix is normal.  The spleen, pancreas, adrenal glands, and kidneys are normal.  Stomach is distended with ingested contrast. There are no dilated or thickened bowel loops. Small volume of stool throughout the colon. No signs of bowel inflammation. No free air, free fluid, or intra-abdominal fluid collection.  Abdominal aorta is normal in caliber. No retroperitoneal or mesenteric adenopathy.  Within the pelvis the bladder is  physiologically distended. Prostate gland is normal in size. No inguinal hernia. Scattered bone islands are noted.  No acute or suspicious osseous abnormalities.  IMPRESSION: 1. No acute abnormality in the abdomen/pelvis. 2. Two lesions in the left lobe of the liver, largest measuring 1.6 cm, consistent with hemangiomas, corresponding to that on ultrasound. 3. Cholelithiasis without CT findings of cholecystitis.   Electronically Signed   By: Jeb Levering M.D.   On: 12/17/2014 00:35    Oren Binet, MD  [facility] Pager:[phone]  If 7PM-7AM, please contact night-coverage [email or web address] Password [facility] 12/17/2014, 12:38 PM   LOS: 0 days

## 2014-12-17 NOTE — Consult Note (Signed)
Reason for Consult:abd pain  Referring Physician: Dr Irwin Brakeman is an 29 y.o. male.  HPI: 29 yo male developed right sided abd pain yesterday and came to the ED early on 3/12  for evaluation. Labs were normal. U/s showed possible gallstone in neck without cholecystitis. Given rx for prilosec and sent out from ED sat morning. Went home and was able to eat lunch which didn't affect pain. Pain came back once pain meds from ED wore off. Pain constant, right sided. Doesn't radiate. Had n/v in the ED. No prior symptoms until yesterday. No d/c/melena/hematochezia/dysuria/wt loss/abx use/foreign travel. Had CT tonight which showed Gallstone. Repeat labs are normal. No f/c. Pain is sharp.   Denies PMHx, etoh, NSAIDs  History reviewed. No pertinent past medical history.  History reviewed. No pertinent past surgical history.  No family history on file.  Social History:  reports that he has never smoked. He does not have any smokeless tobacco history on file. He reports that he does not drink alcohol or use illicit drugs.  Allergies: No Known Allergies  Medications: I have reviewed the patient's current medications.  Results for orders placed or performed during the hospital encounter of 12/16/14 (from the past 48 hour(s))  I-Stat Chem 8, ED     Status: None   Collection Time: 12/16/14  5:19 PM  Result Value Ref Range   Sodium 140 135 - 145 mmol/L   Potassium 4.0 3.5 - 5.1 mmol/L   Chloride 102 96 - 112 mmol/L   BUN 15 6 - 23 mg/dL   Creatinine, Ser 0.80 0.50 - 1.35 mg/dL   Glucose, Bld 98 70 - 99 mg/dL   Calcium, Ion 1.20 1.12 - 1.23 mmol/L   TCO2 23 0 - 100 mmol/L   Hemoglobin 15.6 13.0 - 17.0 g/dL   HCT 46.0 39.0 - 52.0 %  Comprehensive metabolic panel     Status: Abnormal   Collection Time: 12/16/14  9:30 PM  Result Value Ref Range   Sodium 137 135 - 145 mmol/L   Potassium 4.0 3.5 - 5.1 mmol/L   Chloride 104 96 - 112 mmol/L   CO2 25 19 - 32 mmol/L   Glucose, Bld 103 (H) 70  - 99 mg/dL   BUN 14 6 - 23 mg/dL   Creatinine, Ser 0.75 0.50 - 1.35 mg/dL   Calcium 9.2 8.4 - 10.5 mg/dL   Total Protein 7.1 6.0 - 8.3 g/dL   Albumin 3.9 3.5 - 5.2 g/dL   AST 20 0 - 37 U/L   ALT 16 0 - 53 U/L   Alkaline Phosphatase 69 39 - 117 U/L   Total Bilirubin 0.6 0.3 - 1.2 mg/dL   GFR calc non Af Amer >90 >90 mL/min   GFR calc Af Amer >90 >90 mL/min    Comment: (NOTE) The eGFR has been calculated using the CKD EPI equation. This calculation has not been validated in all clinical situations. eGFR's persistently <90 mL/min signify possible Chronic Kidney Disease.    Anion gap 8 5 - 15  Lipase, blood     Status: None   Collection Time: 12/16/14  9:30 PM  Result Value Ref Range   Lipase 23 11 - 59 U/L  CBC with Differential     Status: None   Collection Time: 12/16/14  9:30 PM  Result Value Ref Range   WBC 6.4 4.0 - 10.5 K/uL   RBC 4.98 4.22 - 5.81 MIL/uL   Hemoglobin 14.6 13.0 - 17.0 g/dL  HCT 41.4 39.0 - 52.0 %   MCV 83.1 78.0 - 100.0 fL   MCH 29.3 26.0 - 34.0 pg   MCHC 35.3 30.0 - 36.0 g/dL   RDW 12.5 11.5 - 15.5 %   Platelets 187 150 - 400 K/uL   Neutrophils Relative % 76 43 - 77 %   Neutro Abs 4.8 1.7 - 7.7 K/uL   Lymphocytes Relative 18 12 - 46 %   Lymphs Abs 1.2 0.7 - 4.0 K/uL   Monocytes Relative 5 3 - 12 %   Monocytes Absolute 0.3 0.1 - 1.0 K/uL   Eosinophils Relative 1 0 - 5 %   Eosinophils Absolute 0.1 0.0 - 0.7 K/uL   Basophils Relative 0 0 - 1 %   Basophils Absolute 0.0 0.0 - 0.1 K/uL  I-Stat CG4 Lactic Acid, ED     Status: Abnormal   Collection Time: 12/17/14  1:17 AM  Result Value Ref Range   Lactic Acid, Venous 0.43 (L) 0.5 - 2.0 mmol/L    US Abdomen Complete  12/16/2014   CLINICAL DATA:  29 year old male with a history of abdominal pain for 1 day  EXAM: ULTRASOUND ABDOMEN COMPLETE  COMPARISON:  None.  FINDINGS: Gallbladder: No evidence of gallbladder wall thickening. Negative sonographic Murphy's sign. Heterogeneously echoic material within the  neck of the gallbladder with posterior acoustic shadowing.  Common bile duct: Diameter: 2.4 mm  Liver: Relatively homogeneous appearance of liver parenchyma. Focal uniformly hyperechoic lesions identified.  Left lobe:  1.9 cm x 1.4 cm x 1.9 cm, segment 4B.  Left lobe:  1.5 cm x 1.1 cm x 1.2 cm, segment 4B.  IVC: No abnormality visualized.  Pancreas: Visualized portion unremarkable.  Spleen: Size and appearance within normal limits.  Right Kidney: Length: 9.6 cm. Echogenicity within normal limits. No mass or hydronephrosis visualized.  Left Kidney: Length: 9.3 cm. Echogenicity within normal limits. No mass or hydronephrosis visualized.  Abdominal aorta: No aneurysm visualized.  Other findings: None.  IMPRESSION: Evidence of cholelithiasis without sonographic evidence of acute cholecystitis.  There are 2 separate small hyperechoic lesions of the liver. In a patient of this age without risk factors for carcinoma, most likely entity would be benign hemangioma. If the patient has risk factors for development of cirrhosis/HCC, or otherwise a known primary, further evaluation with MRI would be indicated.  Signed,  Dulcy Fanny. Earleen Newport, DO  [facility]  [facility]   Electronically Signed   By: Corrie Mckusick D.O.   On: 12/16/2014 09:46   Ct Abdomen Pelvis W Contrast  12/17/2014   CLINICAL DATA:  Right mid and lower quadrant abdominal pain.  EXAM: CT ABDOMEN AND PELVIS WITH CONTRAST  TECHNIQUE: Multidetector CT imaging of the abdomen and pelvis was performed using the standard protocol following bolus administration of intravenous contrast.  CONTRAST:  124m OMNIPAQUE IOHEXOL 300 MG/ML  SOLN  COMPARISON:  Right upper quadrant ultrasound performed yesterday.  FINDINGS: The included lung bases are clear.  Two hypodense lesions in the left hepatic lobe measure 1.6 and 1.0 cm with some peripheral puddling of contrast, consistent with hemangiomas previous correspond to the hyperechoic  lesion on ultrasound. Gallbladder is physiologically distended, gallstone tentatively identified in the gallbladder neck. No pericholecystic fluid or wall thickening.  The appendix is normal.  The spleen, pancreas, adrenal glands, and kidneys are normal.  Stomach is distended with ingested contrast. There are no dilated or thickened bowel loops. Small volume of stool throughout the colon. No signs of  bowel inflammation. No free air, free fluid, or intra-abdominal fluid collection.  Abdominal aorta is normal in caliber. No retroperitoneal or mesenteric adenopathy.  Within the pelvis the bladder is physiologically distended. Prostate gland is normal in size. No inguinal hernia. Scattered bone islands are noted.  No acute or suspicious osseous abnormalities.  IMPRESSION: 1. No acute abnormality in the abdomen/pelvis. 2. Two lesions in the left lobe of the liver, largest measuring 1.6 cm, consistent with hemangiomas, corresponding to that on ultrasound. 3. Cholelithiasis without CT findings of cholecystitis.   Electronically Signed   By: Jeb Levering M.D.   On: 12/17/2014 00:35    Review of Systems  Constitutional: Negative for fever, chills and weight loss.  HENT: Negative for congestion and sore throat.   Eyes: Negative for blurred vision and double vision.  Respiratory: Negative for cough and shortness of breath.   Cardiovascular: Negative for chest pain.  Gastrointestinal: Positive for nausea, vomiting and abdominal pain. Negative for diarrhea and constipation.  Genitourinary: Negative for dysuria, urgency and frequency.  Musculoskeletal: Negative for back pain and joint pain.  Skin: Negative for rash.  Neurological: Negative for loss of consciousness and headaches.  Psychiatric/Behavioral: Negative for substance abuse.   Blood pressure 123/63, pulse 66, temperature 99 F (37.2 C), temperature source Oral, resp. rate 20, height _0  (1.727 m), weight 135 lb (61.236 kg), SpO2 99 %. Physical  Exam  Vitals reviewed. Constitutional: He is oriented to person, place, and time. He appears well-developed and well-nourished. No distress.  Not ill appearing. nontoxic  HENT:  Head: Normocephalic and atraumatic.  Right Ear: External ear normal.  Left Ear: External ear normal.  Eyes: Conjunctivae are normal. No scleral icterus.  Neck: Normal range of motion. Neck supple. No tracheal deviation present. No thyromegaly present.  Cardiovascular: Normal rate and normal heart sounds.   Respiratory: Effort normal and breath sounds normal. No stridor. No respiratory distress. He has no wheezes.  GI: Soft. He exhibits no distension. There is tenderness in the right upper quadrant. There is no rigidity, no rebound and no guarding.    Fairly TTP in RUQ. No peritonitis. Soft, nd. No scars  Musculoskeletal: He exhibits no edema or tenderness.  Lymphadenopathy:    He has no cervical adenopathy.  Neurological: He is alert and oriented to person, place, and time. He exhibits normal muscle tone.  Skin: Skin is warm and dry. No rash noted. He is not diaphoretic. No erythema. No pallor.  Psychiatric: He has a normal mood and affect. His behavior is normal. Judgment and thought content normal.    Assessment/Plan: Right upper quadrant pain Cholelithiasis Liver hemangiomas  His pain/tenderness is out of proportion to his imaging and labs. Given his tenderness, i would expect some evidence of cholecystitis on u/s or CT or some elevation in his WBC. He continued to have an appetite even with the pain.   He has bounced back to the ED within 24hrs and given his TTP I don't think he can be discharged. However, I'm not 100% sure his symptoms are due to his GB  rec medicine admit Bowel rest IVF  HIDA scan in AM with ejection fraction. If HIDA normal, may need GI consult Will follow  Leighton Ruff. Redmond Pulling, MD, FACS General, Bariatric, & Minimally Invasive Surgery [facility], [place]   [facility]  M 12/17/2014, 3:42 AM

## 2014-12-17 NOTE — H&P (Signed)
[facility] History and Physical  Joel Tyler BDZ:[id number] DOB: 1986/08/28 DOA: 12/16/2014  Referring physician: EDP PCP: No PCP Per Patient   Chief Complaint: Abdominal pain   HPI: Joel Tyler is a 29 y.o. male who returns to the ED with R sided abdominal pain.  Pain in RUQ abdomen, started yesterday, seen in ED yesterday, found to have stone in neck of gallbladder.  Was discharged.  Returned to ED after pain meds wore off today.  Stone still in neck of gallbladder.  No other findings on CT abd including no acute cholecystitis.  Lab work normal.  Surgery saw patient, wants him admitted for HIDA.  Review of Systems: Systems reviewed.  As above, otherwise negative  History reviewed. No pertinent past medical history. History reviewed. No pertinent past surgical history. Social History:  reports that he has never smoked. He does not have any smokeless tobacco history on file. He reports that he does not drink alcohol or use illicit drugs.  No Known Allergies  No family history on file.   Prior to Admission medications   Medication Sig Start Date End Date Taking? Authorizing Provider  omeprazole (PRILOSEC) 20 MG capsule Take 1 capsule (20 mg total) by mouth daily. 12/16/14   Davonna Belling, MD   Physical Exam: Filed Vitals:   12/17/14 0400  BP: 111/61  Pulse: 67  Temp:   Resp: 11    BP 111/61 mmHg  Pulse 67  Temp(Src) 99 F (37.2 C) (Oral)  Resp 11  Ht 5\' 8"  (1.727 m)  Wt 61.236 kg (135 lb)  BMI 20.53 kg/m2  SpO2 99%  General Appearance:    Alert, oriented, no distress, appears stated age  Head:    Normocephalic, atraumatic  Eyes:    PERRL, EOMI, sclera non-icteric        Nose:   Nares without drainage or epistaxis. Mucosa, turbinates normal  Throat:   Moist mucous membranes. Oropharynx without erythema or exudate.  Neck:   Supple. No carotid bruits.  No thyromegaly.  No lymphadenopathy.   Back:     No CVA tenderness, no spinal tenderness  Lungs:      Clear to auscultation bilaterally, without wheezes, rhonchi or rales  Chest wall:    No tenderness to palpitation  Heart:    Regular rate and rhythm without murmurs, gallops, rubs  Abdomen:     Soft, RUQ TTP, nondistended, normal bowel sounds, no organomegaly  Genitalia:    deferred  Rectal:    deferred  Extremities:   No clubbing, cyanosis or edema.  Pulses:   2+ and symmetric all extremities  Skin:   Skin color, texture, turgor normal, no rashes or lesions  Lymph nodes:   Cervical, supraclavicular, and axillary nodes normal  Neurologic:   CNII-XII intact. Normal strength, sensation and reflexes      throughout    Labs on Admission:  Basic Metabolic Panel:  Recent Labs Lab 12/16/14 0718 12/16/14 1719 12/16/14 2130  NA 138 140 137  K 3.8 4.0 4.0  CL 104 102 104  CO2 30  --  25  GLUCOSE 94 98 103*  BUN 11 15 14   CREATININE 0.75 0.80 0.75  CALCIUM 8.8  --  9.2   Liver Function Tests:  Recent Labs Lab 12/16/14 0718 12/16/14 2130  AST 21 20  ALT 17 16  ALKPHOS 69 69  BILITOT 0.6 0.6  PROT 7.1 7.1  ALBUMIN 3.9 3.9    Recent Labs Lab 12/16/14 0718 12/16/14 2130  LIPASE  30 23   No results for input(s): AMMONIA in the last 168 hours. CBC:  Recent Labs Lab 12/16/14 0718 12/16/14 1719 12/16/14 2130  WBC 4.8  --  6.4  NEUTROABS 2.4  --  4.8  HGB 14.9 15.6 14.6  HCT 42.3 46.0 41.4  MCV 83.4  --  83.1  PLT 192  --  187   Cardiac Enzymes: No results for input(s): CKTOTAL, CKMB, CKMBINDEX, TROPONINI in the last 168 hours.  BNP (last 3 results) No results for input(s): PROBNP in the last 8760 hours. CBG: No results for input(s): GLUCAP in the last 168 hours.  Radiological Exams on Admission: US Abdomen Complete  12/16/2014   CLINICAL DATA:  29 year old male with a history of abdominal pain for 1 day  EXAM: ULTRASOUND ABDOMEN COMPLETE  COMPARISON:  None.  FINDINGS: Gallbladder: No evidence of gallbladder wall thickening. Negative sonographic Murphy's sign.  Heterogeneously echoic material within the neck of the gallbladder with posterior acoustic shadowing.  Common bile duct: Diameter: 2.4 mm  Liver: Relatively homogeneous appearance of liver parenchyma. Focal uniformly hyperechoic lesions identified.  Left lobe:  1.9 cm x 1.4 cm x 1.9 cm, segment 4B.  Left lobe:  1.5 cm x 1.1 cm x 1.2 cm, segment 4B.  IVC: No abnormality visualized.  Pancreas: Visualized portion unremarkable.  Spleen: Size and appearance within normal limits.  Right Kidney: Length: 9.6 cm. Echogenicity within normal limits. No mass or hydronephrosis visualized.  Left Kidney: Length: 9.3 cm. Echogenicity within normal limits. No mass or hydronephrosis visualized.  Abdominal aorta: No aneurysm visualized.  Other findings: None.  IMPRESSION: Evidence of cholelithiasis without sonographic evidence of acute cholecystitis.  There are 2 separate small hyperechoic lesions of the liver. In a patient of this age without risk factors for carcinoma, most likely entity would be benign hemangioma. If the patient has risk factors for development of cirrhosis/HCC, or otherwise a known primary, further evaluation with MRI would be indicated.  Signed,  Dulcy Fanny. Earleen Newport, DO  [facility]  [facility]   Electronically Signed   By: Corrie Mckusick D.O.   On: 12/16/2014 09:46   Ct Abdomen Pelvis W Contrast  12/17/2014   CLINICAL DATA:  Right mid and lower quadrant abdominal pain.  EXAM: CT ABDOMEN AND PELVIS WITH CONTRAST  TECHNIQUE: Multidetector CT imaging of the abdomen and pelvis was performed using the standard protocol following bolus administration of intravenous contrast.  CONTRAST:  158mL OMNIPAQUE IOHEXOL 300 MG/ML  SOLN  COMPARISON:  Right upper quadrant ultrasound performed yesterday.  FINDINGS: The included lung bases are clear.  Two hypodense lesions in the left hepatic lobe measure 1.6 and 1.0 cm with some peripheral puddling of contrast, consistent with  hemangiomas previous correspond to the hyperechoic lesion on ultrasound. Gallbladder is physiologically distended, gallstone tentatively identified in the gallbladder neck. No pericholecystic fluid or wall thickening.  The appendix is normal.  The spleen, pancreas, adrenal glands, and kidneys are normal.  Stomach is distended with ingested contrast. There are no dilated or thickened bowel loops. Small volume of stool throughout the colon. No signs of bowel inflammation. No free air, free fluid, or intra-abdominal fluid collection.  Abdominal aorta is normal in caliber. No retroperitoneal or mesenteric adenopathy.  Within the pelvis the bladder is physiologically distended. Prostate gland is normal in size. No inguinal hernia. Scattered bone islands are noted.  No acute or suspicious osseous abnormalities.  IMPRESSION: 1. No acute abnormality in the abdomen/pelvis. 2. Two lesions  in the left lobe of the liver, largest measuring 1.6 cm, consistent with hemangiomas, corresponding to that on ultrasound. 3. Cholelithiasis without CT findings of cholecystitis.   Electronically Signed   By: Jeb Levering M.D.   On: 12/17/2014 00:35    EKG: Independently reviewed.  Assessment/Plan Active Problems:   Cholelithiasis   1. Cholelithiasis - given the lack of cholecystitis on imaging, surgery wants to make reasonably sure that this gallstone is the cause of his abdominal pain before performing cholecystectomy. 1. See Dr. Dois Davenport note 2. HIDA scan ordered for this morning 3. If normal, needs GI consult 4. If abnormal / suggestive that the gallstone in the neck of his gallbladder is indeed causing his symptoms then likely needs surgery.  Code Status: Full Code  Family Communication: Family at bedside Disposition Plan: Admit to inpatient   Time spent: 50 min  Joel Tyler M. [facility] Pager [phone]  If 7AM-7PM, please contact the day team taking care of the patient [email or web address] Password  [facility] 12/17/2014, 5:06 AM

## 2014-12-17 NOTE — Progress Notes (Signed)
Subjective: RUQ pain  Objective: Vital signs in last 24 hours: Temp:  [98.6 F (37 C)-99.2 F (37.3 C)] 98.6 F (37 C) (03/13 9485) Pulse Rate:  [64-89] 70 (03/13 0613) Resp:  [11-20] 16 (03/13 0613) BP: (103-135)/(43-85) 116/74 mmHg (03/13 0613) SpO2:  [97 %-100 %] 100 % (03/13 0613) Weight:  [132 lb 11.5 oz (60.2 kg)-135 lb (61.236 kg)] 132 lb 11.5 oz (60.2 kg) (03/13 4627) Last BM Date: 12/16/14  Intake/Output from previous day:   Intake/Output this shift:    General appearance: alert and cooperative Resp: clear to auscultation bilaterally Cardio: regular rate and rhythm GI: very tender RUQ, no other tenderness  Lab Results:   Recent Labs  12/16/14 0718 12/16/14 1719 12/16/14 2130  WBC 4.8  --  6.4  HGB 14.9 15.6 14.6  HCT 42.3 46.0 41.4  PLT 192  --  187   BMET  Recent Labs  12/16/14 0718 12/16/14 1719 12/16/14 2130  NA 138 140 137  K 3.8 4.0 4.0  CL 104 102 104  CO2 30  --  25  GLUCOSE 94 98 103*  BUN 11 15 14   CREATININE 0.75 0.80 0.75  CALCIUM 8.8  --  9.2   PT/INR No results for input(s): LABPROT, INR in the last 72 hours. ABG No results for input(s): PHART, HCO3 in the last 72 hours.  Invalid input(s): PCO2, PO2  Studies/Results: US Abdomen Complete  12/16/2014   CLINICAL DATA:  29 year old male with a history of abdominal pain for 1 day  EXAM: ULTRASOUND ABDOMEN COMPLETE  COMPARISON:  None.  FINDINGS: Gallbladder: No evidence of gallbladder wall thickening. Negative sonographic Murphy's sign. Heterogeneously echoic material within the neck of the gallbladder with posterior acoustic shadowing.  Common bile duct: Diameter: 2.4 mm  Liver: Relatively homogeneous appearance of liver parenchyma. Focal uniformly hyperechoic lesions identified.  Left lobe:  1.9 cm x 1.4 cm x 1.9 cm, segment 4B.  Left lobe:  1.5 cm x 1.1 cm x 1.2 cm, segment 4B.  IVC: No abnormality visualized.  Pancreas: Visualized portion unremarkable.  Spleen: Size and appearance  within normal limits.  Right Kidney: Length: 9.6 cm. Echogenicity within normal limits. No mass or hydronephrosis visualized.  Left Kidney: Length: 9.3 cm. Echogenicity within normal limits. No mass or hydronephrosis visualized.  Abdominal aorta: No aneurysm visualized.  Other findings: None.  IMPRESSION: Evidence of cholelithiasis without sonographic evidence of acute cholecystitis.  There are 2 separate small hyperechoic lesions of the liver. In a patient of this age without risk factors for carcinoma, most likely entity would be benign hemangioma. If the patient has risk factors for development of cirrhosis/HCC, or otherwise a known primary, further evaluation with MRI would be indicated.  Signed,  Dulcy Fanny. Earleen Newport, DO  [facility]  [facility]   Electronically Signed   By: Corrie Mckusick D.O.   On: 12/16/2014 09:46   Ct Abdomen Pelvis W Contrast  12/17/2014   CLINICAL DATA:  Right mid and lower quadrant abdominal pain.  EXAM: CT ABDOMEN AND PELVIS WITH CONTRAST  TECHNIQUE: Multidetector CT imaging of the abdomen and pelvis was performed using the standard protocol following bolus administration of intravenous contrast.  CONTRAST:  185mL OMNIPAQUE IOHEXOL 300 MG/ML  SOLN  COMPARISON:  Right upper quadrant ultrasound performed yesterday.  FINDINGS: The included lung bases are clear.  Two hypodense lesions in the left hepatic lobe measure 1.6 and 1.0 cm with some peripheral puddling of contrast, consistent with hemangiomas previous correspond  to the hyperechoic lesion on ultrasound. Gallbladder is physiologically distended, gallstone tentatively identified in the gallbladder neck. No pericholecystic fluid or wall thickening.  The appendix is normal.  The spleen, pancreas, adrenal glands, and kidneys are normal.  Stomach is distended with ingested contrast. There are no dilated or thickened bowel loops. Small volume of stool throughout the colon. No signs of bowel  inflammation. No free air, free fluid, or intra-abdominal fluid collection.  Abdominal aorta is normal in caliber. No retroperitoneal or mesenteric adenopathy.  Within the pelvis the bladder is physiologically distended. Prostate gland is normal in size. No inguinal hernia. Scattered bone islands are noted.  No acute or suspicious osseous abnormalities.  IMPRESSION: 1. No acute abnormality in the abdomen/pelvis. 2. Two lesions in the left lobe of the liver, largest measuring 1.6 cm, consistent with hemangiomas, corresponding to that on ultrasound. 3. Cholelithiasis without CT findings of cholecystitis.   Electronically Signed   By: Jeb Levering M.D.   On: 12/17/2014 00:35    Anti-infectives: Anti-infectives    None      Assessment/Plan: Cholelithiasis - HIDA pending as pain seems out of proportion in light of labs and imaging studies. We will follow-up. I D/W him.  LOS: 0 days    Vi Biddinger E 12/17/2014

## 2014-12-17 NOTE — ED Notes (Signed)
Dr Sharol Given given a copy of lactic acid results .[place]

## 2014-12-17 NOTE — ED Provider Notes (Signed)
Care assumed from prior team.  Patient with right upper quadrant pain, seen earlier today returns with worsening pain.  Patient with CT scan showing cholelithiasis.  Pain appears to be out of proportion to exam.  Patient has been seen by Dr. Redmond Pulling with surgery.  He requests medicine admission with hiatus scan.  Linton Flemings, MD 12/17/14 [phone]

## 2014-12-17 NOTE — Consult Note (Addendum)
Referring Provider: [facility] Primary Care Physician:  No PCP Per Patient Primary Gastroenterologist:  unassigned  Reason for Consultation:   Epigastric pain    HPI: Joel Tyler is a 29 y.o. male admitted March 12 with complaints of abdominal pain. Patient recently came to the [place] 10 months ago from [place]. He reports that he has been having epigastric pain radiating into the right upper quadrant for several days. Yesterday the pain became severe. He was seen in the emergency room and found to have a stone in the neck of the gallbladder he was discharged but returned to the emergency room after pain meds wore off. He had a HIDA scan this morning that was normal. He says his pain is alleviated for short periods of time with ingestion of small amounts of food but then quickly comes back. He denies heartburn, his pain is associated with nausea. He vomited once yesterday but he feels that was do to the pain medicine he was given through the IV hasn't made him very dizzy. Patient admits that he has an affinity for very spicy foods. He also says that he has been under a lot of stress as he has a new 48-day-old baby and his wife needs help caring for the baby and there 36-year-old son. Patient denies any bright red blood per rectum, melena, hematemesis, weight loss, recent use of antibiotics, or travel outside the country since coming to the [place] 10 months ago. He denies use of alcohol. He denies use of nonsteroidal anti-inflammatory drugs. He does not smoke tobacco or use alcohol. He has not had any anorexia. Patient was evaluated by surgery who felt if his HIDA was normal, GI evaluation warranted.   History reviewed. No pertinent past medical history.  History reviewed. No pertinent past surgical history.  Prior to Admission medications   Medication Sig Start Date End Date Taking? Authorizing Provider  omeprazole (PRILOSEC) 20 MG capsule Take 1 capsule (20 mg total) by  mouth daily. 12/16/14   Davonna Belling, MD    Current Facility-Administered Medications  Medication Dose Route Frequency Provider Last Rate Last Dose  . heparin injection 5,000 Units  5,000 Units Subcutaneous 3 times per day Etta Quill, DO      . HYDROmorphone (DILAUDID) injection 1 mg  1 mg Intravenous Q4H PRN Etta Quill, DO   1 mg at 12/17/14 1232  . ondansetron (ZOFRAN) tablet 4 mg  4 mg Oral Q6H PRN Etta Quill, DO       Or  . ondansetron [facility]) injection 4 mg  4 mg Intravenous Q6H PRN Etta Quill, DO      . pantoprazole (PROTONIX) EC tablet 40 mg  40 mg Oral Q1200 Shanker Kristeen Mans, MD      . sincalide [facility]) 5 MCG injection           . sterile water (preservative free) injection             Allergies as of 12/16/2014  . (No Known Allergies)    No family history on file.  History   Social History  . Marital Status: Married    Spouse Name: N/A  . Number of Children: N/A  . Years of Education: N/A   Occupational History  . Not on file.   Social History Main Topics  . Smoking status: Never Smoker   . Smokeless tobacco: Not on file  . Alcohol Use: No  . Drug Use: No  . Sexual Activity:  Not on file   Other Topics Concern  . Not on file   Social History Narrative    Review of Systems: Gen: Denies any fever, chills, sweats, anorexia, fatigue, weakness, malaise, weight loss, and sleep disorder CV: Denies chest pain, angina, palpitations, syncope, orthopnea, PND, peripheral edema, and claudication. Resp: Denies dyspnea at rest, dyspnea with exercise, cough, sputum, wheezing, coughing up blood, and pleurisy. GI:Has epigastric pain and RUQ pain. GU : Denies urinary burning, blood in urine, urinary frequency, urinary hesitancy, nocturnal urination, and urinary incontinence. MS: Denies joint pain, limitation of movement, and swelling, stiffness, low back pain, extremity pain. Denies muscle weakness, cramps, atrophy.  Derm: Denies rash, itching, dry  skin, hives, moles, warts, or unhealing ulcers.  Psych: Denies depression, anxiety, memory loss, suicidal ideation, hallucinations, paranoia, and confusion. Heme: Denies bruising, bleeding, and enlarged lymph nodes. Neuro:  Denies any headaches, dizziness, paresthesias. Endo:  Denies any problems with DM, thyroid, adrenal function.  Physical Exam: Vital signs in last 24 hours: Temp:  [98.6 F (37 C)-99.2 F (37.3 C)] 98.6 F (37 C) (03/13 0258) Pulse Rate:  [64-89] 70 (03/13 0613) Resp:  [11-20] 16 (03/13 0613) BP: (103-135)/(43-85) 116/74 mmHg (03/13 0613) SpO2:  [97 %-100 %] 100 % (03/13 5277) Weight:  [132 lb 11.5 oz (60.2 kg)-135 lb (61.236 kg)] 132 lb 11.5 oz (60.2 kg) (03/13 8242) Last BM Date: 12/16/14 General:   Alert,  Well-developed, well-nourished, pleasant and cooperative in NAD Head:  Normocephalic and atraumatic. Eyes:  Sclera clear, no icterus.  Conjunctiva pink. Ears:  Normal auditory acuity. Nose:  No deformity, discharge,  or lesions. Mouth:  No deformity or lesions.   Neck:  Supple; no masses or thyromegaly. Lungs:  Clear throughout to auscultation  Heart:  Regular rate and rhythm; no murmurs. Abdomen:  Soft,tender epigastric area and RUQ, + BS active,nonpalp mass or hsm. No CVAT  Rectal:  Deferred  Msk:  Symmetrical without gross deformities. . Pulses:  Normal pulses noted. Extremities:  Without clubbing or edema. Neurologic:  Alert and  oriented x4;  grossly normal neurologically. Skin:  Intact without significant lesions or rashes.. Psych:  Alert and cooperative. Normal mood and affect.    Lab Results:  Recent Labs  12/16/14 0718 12/16/14 1719 12/16/14 2130  WBC 4.8  --  6.4  HGB 14.9 15.6 14.6  HCT 42.3 46.0 41.4  PLT 192  --  187   BMET  Recent Labs  12/16/14 0718 12/16/14 1719 12/16/14 2130  NA 138 140 137  K 3.8 4.0 4.0  CL 104 102 104  CO2 30  --  25  GLUCOSE 94 98 103*  BUN 11 15 14   CREATININE 0.75 0.80 0.75  CALCIUM 8.8  --   9.2   LFT  Recent Labs  12/16/14 2130  PROT 7.1  ALBUMIN 3.9  AST 20  ALT 16  ALKPHOS 69  BILITOT 0.6      Studies/Results: Nm Hepatobiliary Liver Func  12/17/2014   CLINICAL DATA:  29 year old male with history of abdominal pain and cholelithiasis noted on recent CT examination.  EXAM: NUCLEAR MEDICINE HEPATOBILIARY IMAGING WITH GALLBLADDER EF  TECHNIQUE: Sequential images of the abdomen were obtained out to 60 minutes following intravenous administration of radiopharmaceutical. After slow intravenous infusion of 1.2 micrograms Cholecystokinin, gallbladder ejection fraction was determined.  RADIOPHARMACEUTICALS:  5 [employer] PN-36R Choletec  COMPARISON:  No priors.  FINDINGS: Rapid uptake of radiotracer throughout the hepatic parenchyma. Gallbladder activity was noted by 10 min. At 60 min,  most of the activity was within the gallbladder. Following administration of cholecystokinin, activity rapidly entered the small bowel. Gallbladder ejection fraction was 68%. At 45 min, normal ejection fraction is greater than 40%.  IMPRESSION: 1. Normal study.  No findings to suggest acute cholecystitis.   Electronically Signed   By: Vinnie Langton M.D.   On: 12/17/2014 11:45   US Abdomen Complete  12/16/2014   CLINICAL DATA:  29 year old male with a history of abdominal pain for 1 day  EXAM: ULTRASOUND ABDOMEN COMPLETE  COMPARISON:  None.  FINDINGS: Gallbladder: No evidence of gallbladder wall thickening. Negative sonographic Murphy's sign. Heterogeneously echoic material within the neck of the gallbladder with posterior acoustic shadowing.  Common bile duct: Diameter: 2.4 mm  Liver: Relatively homogeneous appearance of liver parenchyma. Focal uniformly hyperechoic lesions identified.  Left lobe:  1.9 cm x 1.4 cm x 1.9 cm, segment 4B.  Left lobe:  1.5 cm x 1.1 cm x 1.2 cm, segment 4B.  IVC: No abnormality visualized.  Pancreas: Visualized portion unremarkable.  Spleen: Size and appearance within normal  limits.  Right Kidney: Length: 9.6 cm. Echogenicity within normal limits. No mass or hydronephrosis visualized.  Left Kidney: Length: 9.3 cm. Echogenicity within normal limits. No mass or hydronephrosis visualized.  Abdominal aorta: No aneurysm visualized.  Other findings: None.  IMPRESSION: Evidence of cholelithiasis without sonographic evidence of acute cholecystitis.  There are 2 separate small hyperechoic lesions of the liver. In a patient of this age without risk factors for carcinoma, most likely entity would be benign hemangioma. If the patient has risk factors for development of cirrhosis/HCC, or otherwise a known primary, further evaluation with MRI would be indicated.  Signed,  Dulcy Fanny. Earleen Newport, DO  [facility]  [facility]   Electronically Signed   By: Corrie Mckusick D.O.   On: 12/16/2014 09:46   Ct Abdomen Pelvis W Contrast  12/17/2014   CLINICAL DATA:  Right mid and lower quadrant abdominal pain.  EXAM: CT ABDOMEN AND PELVIS WITH CONTRAST  TECHNIQUE: Multidetector CT imaging of the abdomen and pelvis was performed using the standard protocol following bolus administration of intravenous contrast.  CONTRAST:  150mL OMNIPAQUE IOHEXOL 300 MG/ML  SOLN  COMPARISON:  Right upper quadrant ultrasound performed yesterday.  FINDINGS: The included lung bases are clear.  Two hypodense lesions in the left hepatic lobe measure 1.6 and 1.0 cm with some peripheral puddling of contrast, consistent with hemangiomas previous correspond to the hyperechoic lesion on ultrasound. Gallbladder is physiologically distended, gallstone tentatively identified in the gallbladder neck. No pericholecystic fluid or wall thickening.  The appendix is normal.  The spleen, pancreas, adrenal glands, and kidneys are normal.  Stomach is distended with ingested contrast. There are no dilated or thickened bowel loops. Small volume of stool throughout the colon. No signs of bowel inflammation. No  free air, free fluid, or intra-abdominal fluid collection.  Abdominal aorta is normal in caliber. No retroperitoneal or mesenteric adenopathy.  Within the pelvis the bladder is physiologically distended. Prostate gland is normal in size. No inguinal hernia. Scattered bone islands are noted.  No acute or suspicious osseous abnormalities.  IMPRESSION: 1. No acute abnormality in the abdomen/pelvis. 2. Two lesions in the left lobe of the liver, largest measuring 1.6 cm, consistent with hemangiomas, corresponding to that on ultrasound. 3. Cholelithiasis without CT findings of cholecystitis.   Electronically Signed   By: Jeb Levering M.D.   On: 12/17/2014 00:35    IMPRESSION:/PLAN: 30 year old male  admitted through the ER with cholelithiasis, right upper quadrant and epigastric pain. CT scan with cholelithiasis without CT findings of cholecystitis. Gallstone tentatively identified in the gallbladder neck. No Pericholecystic fluid or wall thickening noted. LFTs normal. White blood count normal. HIDA scan normal. Labs and imaging not supportive of patient's symptoms. Patient has been evaluated by surgery who was not convinced his symptoms are solely due to his gallbladder. Patient to be scheduled for EGD to evaluate for possible gastritis or ulcer tomorrow morning.   Hvozdovic, Deloris Ping 12/17/2014,  Pager [phone]  Patient seen, examined, and I agree with the above documentation, including the assessment and plan. Patient has significant pain in the epigastrium of unclear etiology. CT scan shows no acute abnormality other than cholelithiasis without cholecystitis and 2 small hemangiomas in the liver. Ultrasound shows also gallstones without cholecystitis. HIDA scan also was normal LFTs are normal Given these findings, cholecystitis is felt unlikely and surgery asked that we evaluate for upper endoscopy Cannot exclude gastritis or ulcer disease. EGD tomorrow with Dr. Henrene Pastor PPI The nature of the  procedure, as well as the risks, benefits, and alternatives were carefully and thoroughly reviewed with the patient. Ample time for discussion and questions allowed. The patient understood, was satisfied, and agreed to proceed.   Lajuan Lines. Long Brimage, M.D.  12/17/2014

## 2014-12-17 NOTE — ED Notes (Signed)
Patient transported to CT 

## 2014-12-18 ENCOUNTER — Encounter (HOSPITAL_COMMUNITY)
Admission: EM | Disposition: A | Discharge status: Home / Self Care | Payer: Self-pay | Source: Home / Self Care | Attending: Internal Medicine

## 2014-12-18 ENCOUNTER — Encounter (HOSPITAL_COMMUNITY): Payer: Self-pay | Admitting: *Deleted

## 2014-12-18 HISTORY — PX: ESOPHAGOGASTRODUODENOSCOPY: SHX5428

## 2014-12-18 LAB — CBC
HCT: 42.2 % (ref 39.0–52.0)
HEMOGLOBIN: 14.5 g/dL (ref 13.0–17.0)
MCH: 28.8 pg (ref 26.0–34.0)
MCHC: 34.4 g/dL (ref 30.0–36.0)
MCV: 83.7 fL (ref 78.0–100.0)
PLATELETS: 188 10*3/uL (ref 150–400)
RBC: 5.04 MIL/uL (ref 4.22–5.81)
RDW: 12.4 % (ref 11.5–15.5)
WBC: 5.7 10*3/uL (ref 4.0–10.5)

## 2014-12-18 LAB — COMPREHENSIVE METABOLIC PANEL
ALK PHOS: 62 U/L (ref 39–117)
ALT: 13 U/L (ref 0–53)
ANION GAP: 9 (ref 5–15)
AST: 17 U/L (ref 0–37)
Albumin: 3.5 g/dL (ref 3.5–5.2)
BILIRUBIN TOTAL: 0.8 mg/dL (ref 0.3–1.2)
BUN: 6 mg/dL (ref 6–23)
CALCIUM: 9.1 mg/dL (ref 8.4–10.5)
CHLORIDE: 103 mmol/L (ref 96–112)
CO2: 25 mmol/L (ref 19–32)
Creatinine, Ser: 0.76 mg/dL (ref 0.50–1.35)
GFR calc Af Amer: >90 mL/min (ref >90)
GFR calc non Af Amer: >90 mL/min (ref >90)
GLUCOSE: 95 mg/dL (ref 70–99)
POTASSIUM: 3.9 mmol/L (ref 3.5–5.1)
SODIUM: 137 mmol/L (ref 135–145)
Total Protein: 7.2 g/dL (ref 6.0–8.3)

## 2014-12-18 SURGERY — EGD (ESOPHAGOGASTRODUODENOSCOPY)
Anesthesia: Moderate Sedation

## 2014-12-18 MED ORDER — FENTANYL CITRATE 0.05 MG/ML IJ SOLN
INTRAMUSCULAR | Status: DC | PRN
  Administered 2014-12-18 (×2): 25 ug via INTRAVENOUS

## 2014-12-18 MED ORDER — FENTANYL CITRATE 0.05 MG/ML IJ SOLN
INTRAMUSCULAR | Status: AC
  Filled 2014-12-18: qty 2

## 2014-12-18 MED ORDER — MIDAZOLAM HCL 5 MG/ML IJ SOLN
INTRAMUSCULAR | Status: AC
  Filled 2014-12-18: qty 2

## 2014-12-18 MED ORDER — BUTAMBEN-TETRACAINE-BENZOCAINE 2-2-14 % EX AERO
INHALATION_SPRAY | CUTANEOUS | Status: DC | PRN
  Administered 2014-12-18: 2 via TOPICAL

## 2014-12-18 MED ORDER — SODIUM CHLORIDE 0.9 % IV SOLN
INTRAVENOUS | Status: DC
  Administered 2014-12-18: 500 mL via INTRAVENOUS

## 2014-12-18 MED ORDER — DIPHENHYDRAMINE HCL 50 MG/ML IJ SOLN
INTRAMUSCULAR | Status: AC
  Filled 2014-12-18: qty 1

## 2014-12-18 MED ORDER — MIDAZOLAM HCL 10 MG/2ML IJ SOLN
INTRAMUSCULAR | Status: DC | PRN
  Administered 2014-12-18 (×2): 1 mg via INTRAVENOUS
  Administered 2014-12-18: 2 mg via INTRAVENOUS
  Administered 2014-12-18: 1 mg via INTRAVENOUS

## 2014-12-18 NOTE — Interval H&P Note (Signed)
History and Physical Interval Note:  12/18/2014 4:19 PM  Joel Tyler  has presented today for surgery, with the diagnosis of epigastric pain  The various methods of treatment have been discussed with the patient and family. After consideration of risks, benefits and other options for treatment, the patient has consented to  Procedure(s): ESOPHAGOGASTRODUODENOSCOPY (EGD) (N/A) as a surgical intervention .  The patient's history has been reviewed, patient examined, no change in status, stable for surgery.  I have reviewed the patient's chart and labs.  Questions were answered to the patient's satisfaction.     [name]

## 2014-12-18 NOTE — Progress Notes (Signed)
For EGD today at 330 pm by Dr. Henrene Pastor to rule out non-biliary causes of epigastric pain and nausea.  Surgery plans to proceed with lap chole if EGD is negative.  Labs are normal.  GI ATTENDING  Case reviewed in morning report with Dr. Hilarie Fredrickson. Plan EGD today as discussed. If EGD negative, may have calculus biliary colic without cholecystitis.  Docia Chuck. Geri Seminole., M.D. [facility] Division of Gastroenterology

## 2014-12-18 NOTE — Op Note (Signed)
[facility] [address], [id number]   ENDOSCOPY PROCEDURE REPORT  PATIENT: Joel Tyler, Joel Tyler  MR#: [id number] BIRTHDATE: [birth year] , 28  yrs. old GENDER: male ENDOSCOPIST: Eustace Quail, MD REFERRED BY:  [facility] PROCEDURE DATE:  12/18/2014 PROCEDURE:  EGD, diagnostic ASA CLASS:     Class I INDICATIONS:  abdominal pain in the upper right quadrant. MEDICATIONS: Fentanyl 50 mcg IV and Versed 5 mg IV TOPICAL ANESTHETIC: Cetacaine Spray  DESCRIPTION OF PROCEDURE: After the risks benefits and alternatives of the procedure were thoroughly explained, informed consent was obtained.  The PENTAX GASTOROSCOPE [id number] endoscope was introduced through the mouth and advanced to the second portion of the duodenum , Without limitations.  The instrument was slowly withdrawn as the mucosa was fully examined.   EXAM: The esophagus and gastroesophageal junction were completely normal in appearance.  The stomach was entered and closely examined.The antrum, angularis, and lesser curvature were well visualized, including a retroflexed view of the cardia and fundus. The stomach wall was normally distensable.  The scope passed easily through the pylorus into the duodenum.  Retroflexed views revealed a hiatal hernia.     The scope was then withdrawn from the patient and the procedure completed.  COMPLICATIONS: There were no immediate complications.  ENDOSCOPIC IMPRESSION: 1.  Normal EGD. No cause for pain found on this examination 2. Cholelithiasis  RECOMMENDATIONS: 1. Continue PPI 2. Decision regarding laparoscopic cholecystectomy for right upper quadrant pain and cholelithiasis per surgery  Will sign off. Thanks  REPEAT EXAM:  eSigned:  Eustace Quail, MD 12/18/2014 4:46 PM    CC:The Patient and Greer Pickerel MD

## 2014-12-18 NOTE — Progress Notes (Signed)
Patient ID: Joel Tyler, male   DOB: Nov 23, 1985, 29 y.o.   MRN: [id number]     [facility]      [address] [place], [place] [address]    Phone: [phone] FAX: [phone]     Subjective: No n/v. Continued abdominal pain. Labs are normal.   Objective:  Vital signs:  Filed Vitals:   12/17/14 0613 12/17/14 1340 12/17/14 2102 12/18/14 0300  BP: 116/74 114/72 125/71 113/65  Pulse: 70 76 79 70  Temp: 98.6 F (37 C) 98.5 F (36.9 C) 98.8 F (37.1 C) 99.5 F (37.5 C)  TempSrc: Oral Oral Oral Oral  Resp: '16 16 19 18  ' Height:      Weight: 132 lb 11.5 oz (60.2 kg)     SpO2: 100% 99% 100% 99%    Last BM Date: 12/16/14  Intake/Output   Yesterday:  03/13 0701 - 03/14 0700 In: 360 [P.O.:360] Out: -  This shift: I/O last 3 completed shifts: In: 360 [P.O.:360] Out: -    Physical Exam: General: Pt awake/alert/oriented x4 in no acute distress Abdomen: Soft.  Nondistended.  Moderate TTP to RUQ. No evidence of peritonitis.  No incarcerated hernias.    Problem List:   Active Problems:   Cholelithiasis   Right upper quadrant pain    Results:   Labs: Results for orders placed or performed during the hospital encounter of 12/16/14 (from the past 48 hour(s))  I-Stat Chem 8, ED     Status: None   Collection Time: 12/16/14  5:19 PM  Result Value Ref Range   Sodium 140 135 - 145 mmol/L   Potassium 4.0 3.5 - 5.1 mmol/L   Chloride 102 96 - 112 mmol/L   BUN 15 6 - 23 mg/dL   Creatinine, Ser 0.80 0.50 - 1.35 mg/dL   Glucose, Bld 98 70 - 99 mg/dL   Calcium, Ion 1.20 1.12 - 1.23 mmol/L   TCO2 23 0 - 100 mmol/L   Hemoglobin 15.6 13.0 - 17.0 g/dL   HCT 46.0 39.0 - 52.0 %  Comprehensive metabolic panel     Status: Abnormal   Collection Time: 12/16/14  9:30 PM  Result Value Ref Range   Sodium 137 135 - 145 mmol/L   Potassium 4.0 3.5 - 5.1 mmol/L   Chloride 104 96 - 112 mmol/L   CO2 25 19 - 32 mmol/L   Glucose, Bld 103 (H)  70 - 99 mg/dL   BUN 14 6 - 23 mg/dL   Creatinine, Ser 0.75 0.50 - 1.35 mg/dL   Calcium 9.2 8.4 - 10.5 mg/dL   Total Protein 7.1 6.0 - 8.3 g/dL   Albumin 3.9 3.5 - 5.2 g/dL   AST 20 0 - 37 U/L   ALT 16 0 - 53 U/L   Alkaline Phosphatase 69 39 - 117 U/L   Total Bilirubin 0.6 0.3 - 1.2 mg/dL   GFR calc non Af Amer >90 >90 mL/min   GFR calc Af Amer >90 >90 mL/min    Comment: (NOTE) The eGFR has been calculated using the CKD EPI equation. This calculation has not been validated in all clinical situations. eGFR's persistently <90 mL/min signify possible Chronic Kidney Disease.    Anion gap 8 5 - 15  Lipase, blood     Status: None   Collection Time: 12/16/14  9:30 PM  Result Value Ref Range   Lipase 23 11 - 59 U/L  CBC with Differential  Status: None   Collection Time: 12/16/14  9:30 PM  Result Value Ref Range   WBC 6.4 4.0 - 10.5 K/uL   RBC 4.98 4.22 - 5.81 MIL/uL   Hemoglobin 14.6 13.0 - 17.0 g/dL   HCT 41.4 39.0 - 52.0 %   MCV 83.1 78.0 - 100.0 fL   MCH 29.3 26.0 - 34.0 pg   MCHC 35.3 30.0 - 36.0 g/dL   RDW 12.5 11.5 - 15.5 %   Platelets 187 150 - 400 K/uL   Neutrophils Relative % 76 43 - 77 %   Neutro Abs 4.8 1.7 - 7.7 K/uL   Lymphocytes Relative 18 12 - 46 %   Lymphs Abs 1.2 0.7 - 4.0 K/uL   Monocytes Relative 5 3 - 12 %   Monocytes Absolute 0.3 0.1 - 1.0 K/uL   Eosinophils Relative 1 0 - 5 %   Eosinophils Absolute 0.1 0.0 - 0.7 K/uL   Basophils Relative 0 0 - 1 %   Basophils Absolute 0.0 0.0 - 0.1 K/uL  I-Stat CG4 Lactic Acid, ED     Status: Abnormal   Collection Time: 12/17/14  1:17 AM  Result Value Ref Range   Lactic Acid, Venous 0.43 (L) 0.5 - 2.0 mmol/L  Comprehensive metabolic panel     Status: None   Collection Time: 12/18/14  5:51 AM  Result Value Ref Range   Sodium 137 135 - 145 mmol/L   Potassium 3.9 3.5 - 5.1 mmol/L   Chloride 103 96 - 112 mmol/L   CO2 25 19 - 32 mmol/L   Glucose, Bld 95 70 - 99 mg/dL   BUN 6 6 - 23 mg/dL   Creatinine, Ser 0.76  0.50 - 1.35 mg/dL   Calcium 9.1 8.4 - 10.5 mg/dL   Total Protein 7.2 6.0 - 8.3 g/dL   Albumin 3.5 3.5 - 5.2 g/dL   AST 17 0 - 37 U/L   ALT 13 0 - 53 U/L   Alkaline Phosphatase 62 39 - 117 U/L   Total Bilirubin 0.8 0.3 - 1.2 mg/dL   GFR calc non Af Amer >90 >90 mL/min   GFR calc Af Amer >90 >90 mL/min    Comment: (NOTE) The eGFR has been calculated using the CKD EPI equation. This calculation has not been validated in all clinical situations. eGFR's persistently <90 mL/min signify possible Chronic Kidney Disease.    Anion gap 9 5 - 15  CBC     Status: None   Collection Time: 12/18/14  5:51 AM  Result Value Ref Range   WBC 5.7 4.0 - 10.5 K/uL   RBC 5.04 4.22 - 5.81 MIL/uL   Hemoglobin 14.5 13.0 - 17.0 g/dL   HCT 42.2 39.0 - 52.0 %   MCV 83.7 78.0 - 100.0 fL   MCH 28.8 26.0 - 34.0 pg   MCHC 34.4 30.0 - 36.0 g/dL   RDW 12.4 11.5 - 15.5 %   Platelets 188 150 - 400 K/uL    Imaging / Studies: Nm Hepatobiliary Liver Func  12/17/2014   CLINICAL DATA:  29 year old male with history of abdominal pain and cholelithiasis noted on recent CT examination.  EXAM: NUCLEAR MEDICINE HEPATOBILIARY IMAGING WITH GALLBLADDER EF  TECHNIQUE: Sequential images of the abdomen were obtained out to 60 minutes following intravenous administration of radiopharmaceutical. After slow intravenous infusion of 1.2 micrograms Cholecystokinin, gallbladder ejection fraction was determined.  RADIOPHARMACEUTICALS:  5 [employer] [id number] Choletec  COMPARISON:  No priors.  FINDINGS: Rapid uptake of radiotracer throughout  the hepatic parenchyma. Gallbladder activity was noted by 10 min. At 60 min, most of the activity was within the gallbladder. Following administration of cholecystokinin, activity rapidly entered the small bowel. Gallbladder ejection fraction was 68%. At 45 min, normal ejection fraction is greater than 40%.  IMPRESSION: 1. Normal study.  No findings to suggest acute cholecystitis.   Electronically Signed   By:  Vinnie Langton M.D.   On: 12/17/2014 11:45   US Abdomen Complete  12/16/2014   CLINICAL DATA:  29 year old male with a history of abdominal pain for 1 day  EXAM: ULTRASOUND ABDOMEN COMPLETE  COMPARISON:  None.  FINDINGS: Gallbladder: No evidence of gallbladder wall thickening. Negative sonographic Murphy's sign. Heterogeneously echoic material within the neck of the gallbladder with posterior acoustic shadowing.  Common bile duct: Diameter: 2.4 mm  Liver: Relatively homogeneous appearance of liver parenchyma. Focal uniformly hyperechoic lesions identified.  Left lobe:  1.9 cm x 1.4 cm x 1.9 cm, segment 4B.  Left lobe:  1.5 cm x 1.1 cm x 1.2 cm, segment 4B.  IVC: No abnormality visualized.  Pancreas: Visualized portion unremarkable.  Spleen: Size and appearance within normal limits.  Right Kidney: Length: 9.6 cm. Echogenicity within normal limits. No mass or hydronephrosis visualized.  Left Kidney: Length: 9.3 cm. Echogenicity within normal limits. No mass or hydronephrosis visualized.  Abdominal aorta: No aneurysm visualized.  Other findings: None.  IMPRESSION: Evidence of cholelithiasis without sonographic evidence of acute cholecystitis.  There are 2 separate small hyperechoic lesions of the liver. In a patient of this age without risk factors for carcinoma, most likely entity would be benign hemangioma. If the patient has risk factors for development of cirrhosis/HCC, or otherwise a known primary, further evaluation with MRI would be indicated.  Signed,  Dulcy Fanny. Earleen Newport, DO  [facility]  [facility]   Electronically Signed   By: Corrie Mckusick D.O.   On: 12/16/2014 09:46   Ct Abdomen Pelvis W Contrast  12/17/2014   CLINICAL DATA:  Right mid and lower quadrant abdominal pain.  EXAM: CT ABDOMEN AND PELVIS WITH CONTRAST  TECHNIQUE: Multidetector CT imaging of the abdomen and pelvis was performed using the standard protocol following bolus administration of  intravenous contrast.  CONTRAST:  167m OMNIPAQUE IOHEXOL 300 MG/ML  SOLN  COMPARISON:  Right upper quadrant ultrasound performed yesterday.  FINDINGS: The included lung bases are clear.  Two hypodense lesions in the left hepatic lobe measure 1.6 and 1.0 cm with some peripheral puddling of contrast, consistent with hemangiomas previous correspond to the hyperechoic lesion on ultrasound. Gallbladder is physiologically distended, gallstone tentatively identified in the gallbladder neck. No pericholecystic fluid or wall thickening.  The appendix is normal.  The spleen, pancreas, adrenal glands, and kidneys are normal.  Stomach is distended with ingested contrast. There are no dilated or thickened bowel loops. Small volume of stool throughout the colon. No signs of bowel inflammation. No free air, free fluid, or intra-abdominal fluid collection.  Abdominal aorta is normal in caliber. No retroperitoneal or mesenteric adenopathy.  Within the pelvis the bladder is physiologically distended. Prostate gland is normal in size. No inguinal hernia. Scattered bone islands are noted.  No acute or suspicious osseous abnormalities.  IMPRESSION: 1. No acute abnormality in the abdomen/pelvis. 2. Two lesions in the left lobe of the liver, largest measuring 1.6 cm, consistent with hemangiomas, corresponding to that on ultrasound. 3. Cholelithiasis without CT findings of cholecystitis.   Electronically Signed   By: MThreasa Beards  Ehinger M.D.   On: 12/17/2014 00:35    Medications / Allergies:  Scheduled Meds: . heparin  5,000 Units Subcutaneous 3 times per day  . pantoprazole  40 mg Oral Q1200   Continuous Infusions:  PRN Meds:.HYDROmorphone (DILAUDID) injection, ondansetron **OR** ondansetron (ZOFRAN) IV  Antibiotics: Anti-infectives    None        Assessment/Plan Cholelithiasis RUQ abdominal pain HIDA is negative.  Physical exam is out of proportion to labs and imaging.  Will await endoscopy results before we make  further surgical recommendations. Will follow.     Erby Pian, [facility] Surgery Pager [phone]) For consults and floor pages call [phone](7A-4:30P)  12/18/2014 8:02 AM

## 2014-12-18 NOTE — Progress Notes (Signed)
PATIENT DETAILS Name: Joel Tyler Age: 29 y.o. Sex: male Date of Birth: 01-Nov-1985 Admit Date: 12/16/2014 Admitting Physician Etta Quill, DO PCP:No PCP Per Patient  Subjective: Still has RUQ pain  Assessment/Plan: Active Problems:   Right upper quadrant abdominal pain: Admitted with persistent right upper quadrant abdominal pain. Although CT abdomen/ultrasound abdomen shows cholelithiasis, HIDA scan negative. Gastroenterology plans on EGD today, if this is negative, general surgery plans on lap cholecystectomy. Will continue to follow  Disposition: Remain inpatient  Antibiotics:  None   Anti-infectives    None      DVT Prophylaxis: Prophylactic Heparin   Code Status: Full code  Family Communication Brother at bedside  Procedures:  None  CONSULTS:  GI and general surgery  MEDICATIONS: Scheduled Meds: . heparin  5,000 Units Subcutaneous 3 times per day  . pantoprazole  40 mg Oral Q1200   Continuous Infusions:  PRN Meds:.HYDROmorphone (DILAUDID) injection, ondansetron **OR** ondansetron (ZOFRAN) IV    PHYSICAL EXAM: Vital signs in last 24 hours: Filed Vitals:   12/17/14 0613 12/17/14 1340 12/17/14 2102 12/18/14 0300  BP: 116/74 114/72 125/71 113/65  Pulse: 70 76 79 70  Temp: 98.6 F (37 C) 98.5 F (36.9 C) 98.8 F (37.1 C) 99.5 F (37.5 C)  TempSrc: Oral Oral Oral Oral  Resp: 16 16 19 18   Height:      Weight: 60.2 kg (132 lb 11.5 oz)     SpO2: 100% 99% 100% 99%    Weight change:  Filed Weights   12/16/14 1710 12/17/14 0613  Weight: 61.236 kg (135 lb) 60.2 kg (132 lb 11.5 oz)   Body mass index is 20.18 kg/(m^2).   Gen Exam: Awake and alert with clear speech.   Neck: Supple, No JVD.   Chest: B/L Clear.   CVS: S1 S2 Regular, no murmurs.  Abdomen: soft, BS +, Mild tenderness in RUQ , non distended.  Extremities: no edema, lower extremities warm to touch. Neurologic: Non Focal.   Skin: No Rash.   Wounds: N/A.    Intake/Output from previous day:  Intake/Output Summary (Last 24 hours) at 12/18/14 1028 Last data filed at 12/18/14 0700  Gross per 24 hour  Intake    360 ml  Output      0 ml  Net    360 ml     LAB RESULTS: CBC  Recent Labs Lab 12/16/14 0718 12/16/14 1719 12/16/14 2130 12/18/14 0551  WBC 4.8  --  6.4 5.7  HGB 14.9 15.6 14.6 14.5  HCT 42.3 46.0 41.4 42.2  PLT 192  --  187 188  MCV 83.4  --  83.1 83.7  MCH 29.4  --  29.3 28.8  MCHC 35.2  --  35.3 34.4  RDW 12.5  --  12.5 12.4  LYMPHSABS 1.9  --  1.2  --   MONOABS 0.4  --  0.3  --   EOSABS 0.2  --  0.1  --   BASOSABS 0.0  --  0.0  --     Chemistries   Recent Labs Lab 12/16/14 0718 12/16/14 1719 12/16/14 2130 12/18/14 0551  NA 138 140 137 137  K 3.8 4.0 4.0 3.9  CL 104 102 104 103  CO2 30  --  25 25  GLUCOSE 94 98 103* 95  BUN 11 15 14 6   CREATININE 0.75 0.80 0.75 0.76  CALCIUM 8.8  --  9.2 9.1    CBG: No results for input(s):  GLUCAP in the last 168 hours.  GFR Estimated Creatinine Clearance: 116 mL/min (by C-G formula based on Cr of 0.76).  Coagulation profile No results for input(s): INR, PROTIME in the last 168 hours.  Cardiac Enzymes No results for input(s): CKMB, TROPONINI, MYOGLOBIN in the last 168 hours.  Invalid input(s): CK  Invalid input(s): POCBNP No results for input(s): DDIMER in the last 72 hours. No results for input(s): HGBA1C in the last 72 hours. No results for input(s): CHOL, HDL, LDLCALC, TRIG, CHOLHDL, LDLDIRECT in the last 72 hours. No results for input(s): TSH, T4TOTAL, T3FREE, THYROIDAB in the last 72 hours.  Invalid input(s): FREET3 No results for input(s): VITAMINB12, FOLATE, FERRITIN, TIBC, IRON, RETICCTPCT in the last 72 hours.  Recent Labs  12/16/14 0718 12/16/14 2130  LIPASE 30 23    Urine Studies No results for input(s): UHGB, CRYS in the last 72 hours.  Invalid input(s): UACOL, UAPR, USPG, UPH, UTP, UGL, UKET, UBIL, UNIT, UROB, ULEU, UEPI, UWBC,  URBC, UBAC, CAST, UCOM, BILUA  MICROBIOLOGY: No results found for this or any previous visit (from the past 240 hour(s)).  RADIOLOGY STUDIES/RESULTS: Nm Hepatobiliary Liver Func  12/17/2014   CLINICAL DATA:  29 year old male with history of abdominal pain and cholelithiasis noted on recent CT examination.  EXAM: NUCLEAR MEDICINE HEPATOBILIARY IMAGING WITH GALLBLADDER EF  TECHNIQUE: Sequential images of the abdomen were obtained out to 60 minutes following intravenous administration of radiopharmaceutical. After slow intravenous infusion of 1.2 micrograms Cholecystokinin, gallbladder ejection fraction was determined.  RADIOPHARMACEUTICALS:  5 [employer] [id number] Choletec  COMPARISON:  No priors.  FINDINGS: Rapid uptake of radiotracer throughout the hepatic parenchyma. Gallbladder activity was noted by 10 min. At 60 min, most of the activity was within the gallbladder. Following administration of cholecystokinin, activity rapidly entered the small bowel. Gallbladder ejection fraction was 68%. At 45 min, normal ejection fraction is greater than 40%.  IMPRESSION: 1. Normal study.  No findings to suggest acute cholecystitis.   Electronically Signed   By: Vinnie Langton M.D.   On: 12/17/2014 11:45   US Abdomen Complete  12/16/2014   CLINICAL DATA:  29 year old male with a history of abdominal pain for 1 day  EXAM: ULTRASOUND ABDOMEN COMPLETE  COMPARISON:  None.  FINDINGS: Gallbladder: No evidence of gallbladder wall thickening. Negative sonographic Murphy's sign. Heterogeneously echoic material within the neck of the gallbladder with posterior acoustic shadowing.  Common bile duct: Diameter: 2.4 mm  Liver: Relatively homogeneous appearance of liver parenchyma. Focal uniformly hyperechoic lesions identified.  Left lobe:  1.9 cm x 1.4 cm x 1.9 cm, segment 4B.  Left lobe:  1.5 cm x 1.1 cm x 1.2 cm, segment 4B.  IVC: No abnormality visualized.  Pancreas: Visualized portion unremarkable.  Spleen: Size and appearance  within normal limits.  Right Kidney: Length: 9.6 cm. Echogenicity within normal limits. No mass or hydronephrosis visualized.  Left Kidney: Length: 9.3 cm. Echogenicity within normal limits. No mass or hydronephrosis visualized.  Abdominal aorta: No aneurysm visualized.  Other findings: None.  IMPRESSION: Evidence of cholelithiasis without sonographic evidence of acute cholecystitis.  There are 2 separate small hyperechoic lesions of the liver. In a patient of this age without risk factors for carcinoma, most likely entity would be benign hemangioma. If the patient has risk factors for development of cirrhosis/HCC, or otherwise a known primary, further evaluation with MRI would be indicated.  Signed,  Dulcy Fanny. Earleen Newport, DO  [facility]  [facility]   Electronically Signed  By: Corrie Mckusick D.O.   On: 12/16/2014 09:46   Ct Abdomen Pelvis W Contrast  12/17/2014   CLINICAL DATA:  Right mid and lower quadrant abdominal pain.  EXAM: CT ABDOMEN AND PELVIS WITH CONTRAST  TECHNIQUE: Multidetector CT imaging of the abdomen and pelvis was performed using the standard protocol following bolus administration of intravenous contrast.  CONTRAST:  154mL OMNIPAQUE IOHEXOL 300 MG/ML  SOLN  COMPARISON:  Right upper quadrant ultrasound performed yesterday.  FINDINGS: The included lung bases are clear.  Two hypodense lesions in the left hepatic lobe measure 1.6 and 1.0 cm with some peripheral puddling of contrast, consistent with hemangiomas previous correspond to the hyperechoic lesion on ultrasound. Gallbladder is physiologically distended, gallstone tentatively identified in the gallbladder neck. No pericholecystic fluid or wall thickening.  The appendix is normal.  The spleen, pancreas, adrenal glands, and kidneys are normal.  Stomach is distended with ingested contrast. There are no dilated or thickened bowel loops. Small volume of stool throughout the colon. No signs of bowel  inflammation. No free air, free fluid, or intra-abdominal fluid collection.  Abdominal aorta is normal in caliber. No retroperitoneal or mesenteric adenopathy.  Within the pelvis the bladder is physiologically distended. Prostate gland is normal in size. No inguinal hernia. Scattered bone islands are noted.  No acute or suspicious osseous abnormalities.  IMPRESSION: 1. No acute abnormality in the abdomen/pelvis. 2. Two lesions in the left lobe of the liver, largest measuring 1.6 cm, consistent with hemangiomas, corresponding to that on ultrasound. 3. Cholelithiasis without CT findings of cholecystitis.   Electronically Signed   By: Jeb Levering M.D.   On: 12/17/2014 00:35    Oren Binet, MD  [facility] Pager:[phone]  If 7PM-7AM, please contact night-coverage [email or web address] Password [email or web address] 12/18/2014, 10:28 AM   LOS: 1 day

## 2014-12-18 NOTE — H&P (View-Only) (Signed)
For EGD today at 330 pm by Dr. Henrene Pastor to rule out non-biliary causes of epigastric pain and nausea.  Surgery plans to proceed with lap chole if EGD is negative.  Labs are normal.  GI ATTENDING  Case reviewed in morning report with Dr. Hilarie Fredrickson. Plan EGD today as discussed. If EGD negative, may have calculus biliary colic without cholecystitis.  Docia Chuck. Geri Seminole., M.D. [facility] of Gastroenterology

## 2014-12-19 ENCOUNTER — Inpatient Hospital Stay (HOSPITAL_COMMUNITY): Payer: Medicaid Other | Admitting: Certified Registered Nurse Anesthetist

## 2014-12-19 ENCOUNTER — Encounter (HOSPITAL_COMMUNITY): Payer: Self-pay | Admitting: Internal Medicine

## 2014-12-19 ENCOUNTER — Encounter (HOSPITAL_COMMUNITY): Payer: Self-pay

## 2014-12-19 ENCOUNTER — Encounter (HOSPITAL_COMMUNITY): Payer: Self-pay | Admitting: *Deleted

## 2014-12-19 ENCOUNTER — Encounter (HOSPITAL_COMMUNITY)
Admission: EM | Disposition: A | Discharge status: Home / Self Care | Payer: Self-pay | Source: Home / Self Care | Attending: Internal Medicine

## 2014-12-19 HISTORY — PX: CHOLECYSTECTOMY: SHX55

## 2014-12-19 LAB — SURGICAL PCR SCREEN
MRSA, PCR: POSITIVE — AB
Staphylococcus aureus: POSITIVE — AB

## 2014-12-19 SURGERY — LAPAROSCOPIC CHOLECYSTECTOMY
Anesthesia: General

## 2014-12-19 MED ORDER — BUPIVACAINE-EPINEPHRINE (PF) 0.25% -1:200000 IJ SOLN
INTRAMUSCULAR | Status: AC
  Filled 2014-12-19: qty 30

## 2014-12-19 MED ORDER — PROPOFOL 10 MG/ML IV BOLUS
INTRAVENOUS | Status: DC | PRN
  Administered 2014-12-19: 150 mg via INTRAVENOUS
  Administered 2014-12-19: 20 mg via INTRAVENOUS
  Administered 2014-12-19: 80 mg via INTRAVENOUS

## 2014-12-19 MED ORDER — HYDROMORPHONE HCL 1 MG/ML IJ SOLN
1.0000 mg | INTRAMUSCULAR | Status: DC | PRN
  Administered 2014-12-19 – 2014-12-20 (×2): 1 mg via INTRAVENOUS
  Filled 2014-12-19 (×3): qty 1

## 2014-12-19 MED ORDER — MUPIROCIN 2 % EX OINT
1.0000 "application " | TOPICAL_OINTMENT | Freq: Two times a day (BID) | CUTANEOUS | Status: DC
  Administered 2014-12-19 – 2014-12-20 (×3): 1 via NASAL
  Filled 2014-12-19 (×2): qty 22

## 2014-12-19 MED ORDER — MEPERIDINE HCL 25 MG/ML IJ SOLN: 6.2500 mg | INTRAMUSCULAR | Status: DC | PRN

## 2014-12-19 MED ORDER — DEXTROSE-NACL 5-0.9 % IV SOLN
INTRAVENOUS | Status: DC
  Administered 2014-12-19 (×2): via INTRAVENOUS

## 2014-12-19 MED ORDER — FENTANYL CITRATE 0.05 MG/ML IJ SOLN
INTRAMUSCULAR | Status: DC | PRN
  Administered 2014-12-19 (×3): 50 ug via INTRAVENOUS
  Administered 2014-12-19: 100 ug via INTRAVENOUS

## 2014-12-19 MED ORDER — LIDOCAINE HCL (CARDIAC) 20 MG/ML IV SOLN
INTRAVENOUS | Status: DC | PRN
  Administered 2014-12-19: 50 mg via INTRAVENOUS

## 2014-12-19 MED ORDER — LACTATED RINGERS IV SOLN
INTRAVENOUS | Status: DC
  Administered 2014-12-19: 12:00:00 via INTRAVENOUS

## 2014-12-19 MED ORDER — PROMETHAZINE HCL 25 MG/ML IJ SOLN: 6.2500 mg | INTRAMUSCULAR | Status: DC | PRN

## 2014-12-19 MED ORDER — SODIUM CHLORIDE 0.9 % IR SOLN
Status: DC | PRN
  Administered 2014-12-19: 1

## 2014-12-19 MED ORDER — KETOROLAC TROMETHAMINE 30 MG/ML IJ SOLN
30.0000 mg | Freq: Once | INTRAMUSCULAR | Status: AC | PRN
  Administered 2014-12-19: 30 mg via INTRAVENOUS

## 2014-12-19 MED ORDER — HYDROMORPHONE HCL 1 MG/ML IJ SOLN
0.2500 mg | INTRAMUSCULAR | Status: DC | PRN
  Administered 2014-12-19 (×2): 0.5 mg via INTRAVENOUS

## 2014-12-19 MED ORDER — CHLORHEXIDINE GLUCONATE CLOTH 2 % EX PADS
6.0000 | MEDICATED_PAD | Freq: Every day | CUTANEOUS | Status: DC
  Administered 2014-12-19 – 2014-12-20 (×2): 6 via TOPICAL

## 2014-12-19 MED ORDER — PROPOFOL 10 MG/ML IV BOLUS
INTRAVENOUS | Status: AC
  Filled 2014-12-19: qty 20

## 2014-12-19 MED ORDER — FENTANYL CITRATE 0.05 MG/ML IJ SOLN
INTRAMUSCULAR | Status: AC
  Filled 2014-12-19: qty 5

## 2014-12-19 MED ORDER — BUPIVACAINE-EPINEPHRINE 0.25% -1:200000 IJ SOLN
INTRAMUSCULAR | Status: DC | PRN
  Administered 2014-12-19: 15 mL

## 2014-12-19 MED ORDER — DEXAMETHASONE SODIUM PHOSPHATE 4 MG/ML IJ SOLN
INTRAMUSCULAR | Status: DC | PRN
  Administered 2014-12-19: 4 mg via INTRAVENOUS

## 2014-12-19 MED ORDER — DEXAMETHASONE SODIUM PHOSPHATE 4 MG/ML IJ SOLN
INTRAMUSCULAR | Status: AC
  Filled 2014-12-19: qty 1

## 2014-12-19 MED ORDER — LACTATED RINGERS IV SOLN
INTRAVENOUS | Status: DC | PRN
  Administered 2014-12-19 (×2): via INTRAVENOUS

## 2014-12-19 MED ORDER — KETOROLAC TROMETHAMINE 30 MG/ML IJ SOLN
INTRAMUSCULAR | Status: AC
  Filled 2014-12-19: qty 1

## 2014-12-19 MED ORDER — HYDROCODONE-ACETAMINOPHEN 5-325 MG PO TABS
1.0000 | ORAL_TABLET | ORAL | Status: DC | PRN
  Administered 2014-12-20: 1 via ORAL
  Filled 2014-12-19: qty 1

## 2014-12-19 MED ORDER — MIDAZOLAM HCL 2 MG/2ML IJ SOLN
INTRAMUSCULAR | Status: AC
  Filled 2014-12-19: qty 2

## 2014-12-19 MED ORDER — MIDAZOLAM HCL 5 MG/5ML IJ SOLN
INTRAMUSCULAR | Status: DC | PRN
  Administered 2014-12-19: 2 mg via INTRAVENOUS

## 2014-12-19 MED ORDER — ONDANSETRON HCL 4 MG/2ML IJ SOLN
INTRAMUSCULAR | Status: DC | PRN
  Administered 2014-12-19: 4 mg via INTRAVENOUS

## 2014-12-19 MED ORDER — CEFTRIAXONE SODIUM IN DEXTROSE 40 MG/ML IV SOLN
2.0000 g | INTRAVENOUS | Status: AC
  Administered 2014-12-19: 2 g via INTRAVENOUS
  Filled 2014-12-19: qty 50

## 2014-12-19 MED ORDER — HYDROMORPHONE HCL 1 MG/ML IJ SOLN
INTRAMUSCULAR | Status: AC
  Filled 2014-12-19: qty 1

## 2014-12-19 MED ORDER — SUCCINYLCHOLINE CHLORIDE 20 MG/ML IJ SOLN
INTRAMUSCULAR | Status: DC | PRN
  Administered 2014-12-19: 60 mg via INTRAVENOUS

## 2014-12-19 MED ORDER — DEXAMETHASONE SODIUM PHOSPHATE 4 MG/ML IJ SOLN
INTRAMUSCULAR | Status: AC
  Filled 2014-12-19: qty 2

## 2014-12-19 SURGICAL SUPPLY — 37 items
APPLIER CLIP 5 13 M/L LIGAMAX5 (MISCELLANEOUS) ×3
CANISTER SUCTION 2500CC (MISCELLANEOUS) ×3 IMPLANT
CHLORAPREP W/TINT 26ML (MISCELLANEOUS) ×3 IMPLANT
CLIP APPLIE 5 13 M/L LIGAMAX5 (MISCELLANEOUS) ×1 IMPLANT
COVER MAYO STAND STRL (DRAPES) IMPLANT
COVER SURGICAL LIGHT HANDLE (MISCELLANEOUS) ×3 IMPLANT
DECANTER SPIKE VIAL GLASS SM (MISCELLANEOUS) ×3 IMPLANT
DERMABOND ADVANCED (GAUZE/BANDAGES/DRESSINGS) ×2
DERMABOND ADVANCED .7 DNX12 (GAUZE/BANDAGES/DRESSINGS) ×1 IMPLANT
DRAPE C-ARM 42X72 X-RAY (DRAPES) IMPLANT
DRAPE LAPAROSCOPIC ABDOMINAL (DRAPES) ×3 IMPLANT
ELECT REM PT RETURN 9FT ADLT (ELECTROSURGICAL) ×3
ELECTRODE REM PT RTRN 9FT ADLT (ELECTROSURGICAL) ×1 IMPLANT
GLOVE SURG SIGNA 7.5 PF LTX (GLOVE) ×3 IMPLANT
GOWN STRL REUS W/ TWL LRG LVL3 (GOWN DISPOSABLE) ×3 IMPLANT
GOWN STRL REUS W/ TWL XL LVL3 (GOWN DISPOSABLE) ×1 IMPLANT
GOWN STRL REUS W/TWL LRG LVL3 (GOWN DISPOSABLE) ×6
GOWN STRL REUS W/TWL XL LVL3 (GOWN DISPOSABLE) ×2
KIT BASIN OR (CUSTOM PROCEDURE TRAY) ×3 IMPLANT
KIT ROOM TURNOVER OR (KITS) ×3 IMPLANT
LIQUID BAND (GAUZE/BANDAGES/DRESSINGS) ×3 IMPLANT
NS IRRIG 1000ML POUR BTL (IV SOLUTION) ×3 IMPLANT
PAD ARMBOARD 7.5X6 YLW CONV (MISCELLANEOUS) ×3 IMPLANT
POUCH SPECIMEN RETRIEVAL 10MM (ENDOMECHANICALS) IMPLANT
SCISSORS LAP 5X35 DISP (ENDOMECHANICALS) ×3 IMPLANT
SET CHOLANGIOGRAPH 5 50 .035 (SET/KITS/TRAYS/PACK) IMPLANT
SET IRRIG TUBING LAPAROSCOPIC (IRRIGATION / IRRIGATOR) ×3 IMPLANT
SLEEVE ENDOPATH XCEL 5M (ENDOMECHANICALS) ×6 IMPLANT
SPECIMEN JAR SMALL (MISCELLANEOUS) ×3 IMPLANT
SUT MON AB 4-0 PC3 18 (SUTURE) ×3 IMPLANT
TOWEL OR 17X24 6PK STRL BLUE (TOWEL DISPOSABLE) ×3 IMPLANT
TOWEL OR 17X26 10 PK STRL BLUE (TOWEL DISPOSABLE) ×3 IMPLANT
TRAY LAPAROSCOPIC (CUSTOM PROCEDURE TRAY) ×3 IMPLANT
TROCAR XCEL BLUNT TIP 100MML (ENDOMECHANICALS) ×3 IMPLANT
TROCAR XCEL NON-BLD 5MMX100MML (ENDOMECHANICALS) ×3 IMPLANT
TUBING INSUFFLATION (TUBING) ×3 IMPLANT
WATER STERILE IRR 1000ML POUR (IV SOLUTION) IMPLANT

## 2014-12-19 NOTE — Transfer of Care (Signed)
Immediate Anesthesia Transfer of Care Note  Patient: Joel Tyler  Procedure(s) Performed: Procedure(s): DIAGNOSTIC LAPAROSCOPY WITH OMENTAL BIOPSY (N/A)  Patient Location: PACU  Anesthesia Type:General  Level of Consciousness: awake, alert , oriented, patient cooperative and responds to stimulation  Airway & Oxygen Therapy: Patient Spontanous Breathing and Patient connected to nasal cannula oxygen  Post-op Assessment: Report given to RN, Post -op Vital signs reviewed and stable and Patient moving all extremities X 4  Post vital signs: Reviewed and stable  Last Vitals:  Filed Vitals:   12/19/14 1457  BP:   Pulse:   Temp: 36.4 C  Resp:     Complications: No apparent anesthesia complications

## 2014-12-19 NOTE — Anesthesia Postprocedure Evaluation (Signed)
  Anesthesia Post-op Note  Patient: Joel Tyler  Procedure(s) Performed: Procedure(s): DIAGNOSTIC LAPAROSCOPY WITH OMENTAL BIOPSY (N/A)  Patient Location: PACU  Anesthesia Type:General  Level of Consciousness: awake  Airway and Oxygen Therapy: Patient Spontanous Breathing  Post-op Pain: mild  Post-op Assessment: Post-op Vital signs reviewed  Post-op Vital Signs: Reviewed  Last Vitals:  Filed Vitals:   12/19/14 1556  BP: 119/71  Pulse: 71  Temp: 36.1 C  Resp: 15    Complications: No apparent anesthesia complicationss

## 2014-12-19 NOTE — Progress Notes (Signed)
Patient ID: Joel Tyler, male   DOB: 1986-08-31, 29 y.o.   MRN: [id number]  Still with RUQ pain and tenderness Upper endo normal  Will proceed with lap chole today  I discussed the procedure in detail.   We discussed the risks and benefits of a laparoscopic cholecystectomy and possible cholangiogram including, but not limited to bleeding, infection, injury to surrounding structures such as the intestine or liver, bile leak, retained gallstones, need to convert to an open procedure, prolonged diarrhea, blood clots such as  DVT, common bile duct injury, anesthesia risks, and possible need for additional procedures.  The likelihood of improvement in symptoms and return to the patient's normal status is good. We discussed the typical post-operative recovery course.

## 2014-12-19 NOTE — Anesthesia Preprocedure Evaluation (Signed)
Anesthesia Evaluation  Patient identified by MRN, date of birth, ID band Patient awake    Reviewed: Allergy & Precautions, NPO status , Patient's Chart, lab work & pertinent test results  Airway Mallampati: II  TM Distance: >3 FB Neck ROM: Full    Dental no notable dental hx.    Pulmonary neg pulmonary ROS,  breath sounds clear to auscultation  Pulmonary exam normal       Cardiovascular negative cardio ROS  Rhythm:Regular Rate:Normal     Neuro/Psych negative neurological ROS  negative psych ROS   GI/Hepatic negative GI ROS, Neg liver ROS,   Endo/Other  negative endocrine ROS  Renal/GU negative Renal ROS     Musculoskeletal negative musculoskeletal ROS (+)   Abdominal   Peds  Hematology negative hematology ROS (+)   Anesthesia Other Findings   Reproductive/Obstetrics negative OB ROS                             Anesthesia Physical Anesthesia Plan  ASA: I  Anesthesia Plan: General   Post-op Pain Management:    Induction: Intravenous  Airway Management Planned: Oral ETT  Additional Equipment: None  Intra-op Plan:   Post-operative Plan: Extubation in OR  Informed Consent: I have reviewed the patients History and Physical, chart, labs and discussed the procedure including the risks, benefits and alternatives for the proposed anesthesia with the patient or authorized representative who has indicated his/her understanding and acceptance.   Dental advisory given  Plan Discussed with: CRNA  Anesthesia Plan Comments:         Anesthesia Quick Evaluation

## 2014-12-19 NOTE — Progress Notes (Signed)
Patient ID: Joel Tyler, male   DOB: 1986-07-19, 29 y.o.   MRN: [id number]     [facility]      [address] [place], [place] [address]    Phone: [phone] FAX: [phone]     Subjective: EGD done, negative.  Pt still c/o RUQ pain.  Afebrile.   Objective:  Vital signs:  Filed Vitals:   12/18/14 1733 12/18/14 1824 12/18/14 2102 12/19/14 0615  BP: 121/69 117/69 133/73 114/71  Pulse: 70 79 77 70  Temp:   98.1 F (36.7 C) 98.3 F (36.8 C)  TempSrc:   Oral Oral  Resp: '14 14 17 15  ' Height:      Weight:      SpO2: 99% 98% 98% 99%    Last BM Date: 12/16/14  Intake/Output   Yesterday:  03/14 0701 - 03/15 0700 In: 100 [I.V.:100] Out: -  This shift:   Physical Exam: General: Pt awake/alert/oriented x4 in no acute distress  Abdomen: Soft.  Nondistended.  Moderate TTP to RUQ, voluntary guarding.  No evidence of peritonitis.  No incarcerated hernias.    Problem List:   Active Problems:   Cholelithiasis   Right upper quadrant pain    Results:   Labs: Results for orders placed or performed during the hospital encounter of 12/16/14 (from the past 48 hour(s))  Comprehensive metabolic panel     Status: None   Collection Time: 12/18/14  5:51 AM  Result Value Ref Range   Sodium 137 135 - 145 mmol/L   Potassium 3.9 3.5 - 5.1 mmol/L   Chloride 103 96 - 112 mmol/L   CO2 25 19 - 32 mmol/L   Glucose, Bld 95 70 - 99 mg/dL   BUN 6 6 - 23 mg/dL   Creatinine, Ser 0.76 0.50 - 1.35 mg/dL   Calcium 9.1 8.4 - 10.5 mg/dL   Total Protein 7.2 6.0 - 8.3 g/dL   Albumin 3.5 3.5 - 5.2 g/dL   AST 17 0 - 37 U/L   ALT 13 0 - 53 U/L   Alkaline Phosphatase 62 39 - 117 U/L   Total Bilirubin 0.8 0.3 - 1.2 mg/dL   GFR calc non Af Amer >90 >90 mL/min   GFR calc Af Amer >90 >90 mL/min    Comment: (NOTE) The eGFR has been calculated using the CKD EPI equation. This calculation has not been validated in all clinical situations. eGFR's  persistently <90 mL/min signify possible Chronic Kidney Disease.    Anion gap 9 5 - 15  CBC     Status: None   Collection Time: 12/18/14  5:51 AM  Result Value Ref Range   WBC 5.7 4.0 - 10.5 K/uL   RBC 5.04 4.22 - 5.81 MIL/uL   Hemoglobin 14.5 13.0 - 17.0 g/dL   HCT 42.2 39.0 - 52.0 %   MCV 83.7 78.0 - 100.0 fL   MCH 28.8 26.0 - 34.0 pg   MCHC 34.4 30.0 - 36.0 g/dL   RDW 12.4 11.5 - 15.5 %   Platelets 188 150 - 400 K/uL    Imaging / Studies: Nm Hepatobiliary Liver Func  12/17/2014   CLINICAL DATA:  29 year old male with history of abdominal pain and cholelithiasis noted on recent CT examination.  EXAM: NUCLEAR MEDICINE HEPATOBILIARY IMAGING WITH GALLBLADDER EF  TECHNIQUE: Sequential images of the abdomen were obtained out to 60 minutes following intravenous administration of radiopharmaceutical. After slow intravenous infusion of 1.2 micrograms Cholecystokinin, gallbladder ejection  fraction was determined.  RADIOPHARMACEUTICALS:  5 Millicurie TV-15W Choletec  COMPARISON:  No priors.  FINDINGS: Rapid uptake of radiotracer throughout the hepatic parenchyma. Gallbladder activity was noted by 10 min. At 60 min, most of the activity was within the gallbladder. Following administration of cholecystokinin, activity rapidly entered the small bowel. Gallbladder ejection fraction was 68%. At 45 min, normal ejection fraction is greater than 40%.  IMPRESSION: 1. Normal study.  No findings to suggest acute cholecystitis.   Electronically Signed   By: Vinnie Langton M.D.   On: 12/17/2014 11:45    Medications / Allergies:  Scheduled Meds: . cefTRIAXone (ROCEPHIN)  IV  2 g Intravenous On Call to OR  . heparin  5,000 Units Subcutaneous 3 times per day  . pantoprazole  40 mg Oral Q1200   Continuous Infusions:  PRN Meds:.HYDROmorphone (DILAUDID) injection, ondansetron **OR** ondansetron (ZOFRAN) IV  Antibiotics: Anti-infectives    Start     Dose/Rate Route Frequency Ordered Stop   12/19/14 0730   cefTRIAXone (ROCEPHIN) 2 g in dextrose 5 % 50 mL IVPB - Premix    Comments:  Pharmacy may adjust dosing strength, interval, or rate of medication as needed for optimal therapy for the patient Send with patient on call to the OR.  Anesthesia to complete antibiotic administration <3mn [facility]   2 g 100 mL/hr over 30 Minutes Intravenous On call to O.R. 12/19/14 0726 12/20/14 0559       Assessment/Plan Cholelithiasis Negative EGD Will proceed with a lap chole today NPO Risks of surgery discussed including infection, bleeding, injury to surrounding structures and anesthesia risks.  The patient verbalizes understanding and wishes to proceed. Rocephin on call to OR Add IVF Note [facility] [phone](7A-4:30P) For consults and floor pages call [phone](7A-4:30P)  12/19/2014 8:06 AM

## 2014-12-19 NOTE — OR Nursing (Signed)
AMasonRN documented on OR record from [id number]

## 2014-12-19 NOTE — Progress Notes (Signed)
PATIENT DETAILS Name: Joel Tyler Age: 29 y.o. Sex: male Date of Birth: [birth year] Admit Date: 12/16/2014 Admitting Physician Etta Quill, DO PCP:No PCP Per Patient  Subjective: Still has RUQ pain  Assessment/Plan: Active Problems:   Right upper quadrant abdominal pain: Admitted with persistent right upper quadrant abdominal pain. Although CT abdomen/ultrasound abdomen shows cholelithiasis, HIDA scan negative. Both Gastroenterology and Gen Surgery was consulted, underwent EGD on 3/15-was negative. For lap cholecystectomy today, spoke with Emina Riebock PA-C, Gen Surgery will assuem primary service. Hospitalist service will sign off.  Disposition: Remain inpatient  Antibiotics:  None   Anti-infectives    Start     Dose/Rate Route Frequency Ordered Stop   12/19/14 0730  cefTRIAXone (ROCEPHIN) 2 g in dextrose 5 % 50 mL IVPB - Premix    Comments:  Pharmacy may adjust dosing strength, interval, or rate of medication as needed for optimal therapy for the patient Send with patient on call to the OR.  Anesthesia to complete antibiotic administration <24min prior to incision per [facility].   2 g 100 mL/hr over 30 Minutes Intravenous On call to O.R. 12/19/14 0726 12/20/14 0559      DVT Prophylaxis: Prophylactic Heparin   Code Status: Full code  Family Communication Brother at bedside  Procedures:  None  CONSULTS:  GI and general surgery  MEDICATIONS: Scheduled Meds: . cefTRIAXone (ROCEPHIN)  IV  2 g Intravenous On Call to OR  . heparin  5,000 Units Subcutaneous 3 times per day  . pantoprazole  40 mg Oral Q1200   Continuous Infusions: . dextrose 5 % and 0.9% NaCl     PRN Meds:.HYDROmorphone (DILAUDID) injection, ondansetron **OR** ondansetron (ZOFRAN) IV    PHYSICAL EXAM: Vital signs in last 24 hours: Filed Vitals:   12/18/14 1733 12/18/14 1824 12/18/14 2102 12/19/14 0615  BP: 121/69 117/69 133/73 114/71  Pulse: 70 79 77 70  Temp:   98.1 F  (36.7 C) 98.3 F (36.8 C)  TempSrc:   Oral Oral  Resp: 14 14 17 15   Height:      Weight:      SpO2: 99% 98% 98% 99%    Weight change:  Filed Weights   12/16/14 1710 12/17/14 0613  Weight: 61.236 kg (135 lb) 60.2 kg (132 lb 11.5 oz)   Body mass index is 20.18 kg/(m^2).   Gen Exam: Awake and alert with clear speech.   Neck: Supple, No JVD.   Chest: B/L Clear.No rales or rhonchi   CVS: S1 S2 Regular, no murmurs.  Abdomen: soft, BS +,Still with Mild tenderness in RUQ , non distended.  Extremities: no edema, lower extremities warm to touch. Neurologic: Non Focal.   Skin: No Rash.   Wounds: N/A.   Intake/Output from previous day:  Intake/Output Summary (Last 24 hours) at 12/19/14 1004 Last data filed at 12/18/14 1600  Gross per 24 hour  Intake    100 ml  Output      0 ml  Net    100 ml     LAB RESULTS: CBC  Recent Labs Lab 12/16/14 0718 12/16/14 1719 12/16/14 2130 12/18/14 0551  WBC 4.8  --  6.4 5.7  HGB 14.9 15.6 14.6 14.5  HCT 42.3 46.0 41.4 42.2  PLT 192  --  187 188  MCV 83.4  --  83.1 83.7  MCH 29.4  --  29.3 28.8  MCHC 35.2  --  35.3 34.4  RDW 12.5  --  12.5 12.4  LYMPHSABS 1.9  --  1.2  --   MONOABS 0.4  --  0.3  --   EOSABS 0.2  --  0.1  --   BASOSABS 0.0  --  0.0  --     Chemistries   Recent Labs Lab 12/16/14 0718 12/16/14 1719 12/16/14 2130 12/18/14 0551  NA 138 140 137 137  K 3.8 4.0 4.0 3.9  CL 104 102 104 103  CO2 30  --  25 25  GLUCOSE 94 98 103* 95  BUN 11 15 14 6   CREATININE 0.75 0.80 0.75 0.76  CALCIUM 8.8  --  9.2 9.1    CBG: No results for input(s): GLUCAP in the last 168 hours.  GFR Estimated Creatinine Clearance: 116 mL/min (by C-G formula based on Cr of 0.76).  Coagulation profile No results for input(s): INR, PROTIME in the last 168 hours.  Cardiac Enzymes No results for input(s): CKMB, TROPONINI, MYOGLOBIN in the last 168 hours.  Invalid input(s): CK  Invalid input(s): POCBNP No results for input(s):  DDIMER in the last 72 hours. No results for input(s): HGBA1C in the last 72 hours. No results for input(s): CHOL, HDL, LDLCALC, TRIG, CHOLHDL, LDLDIRECT in the last 72 hours. No results for input(s): TSH, T4TOTAL, T3FREE, THYROIDAB in the last 72 hours.  Invalid input(s): FREET3 No results for input(s): VITAMINB12, FOLATE, FERRITIN, TIBC, IRON, RETICCTPCT in the last 72 hours.  Recent Labs  12/16/14 2130  LIPASE 23    Urine Studies No results for input(s): UHGB, CRYS in the last 72 hours.  Invalid input(s): UACOL, UAPR, USPG, UPH, UTP, UGL, UKET, UBIL, UNIT, UROB, ULEU, UEPI, UWBC, URBC, UBAC, CAST, UCOM, BILUA  MICROBIOLOGY: Recent Results (from the past 240 hour(s))  Surgical pcr screen     Status: Abnormal   Collection Time: 12/19/14  7:51 AM  Result Value Ref Range Status   MRSA, PCR POSITIVE (A) NEGATIVE Final   Staphylococcus aureus POSITIVE (A) NEGATIVE Final    Comment:        The Xpert SA Assay (FDA approved for NASAL specimens in patients over 80 years of age), is one component of a comprehensive surveillance program.  Test performance has been validated by [facility] for patients greater than or equal to 62 year old. It is not intended to diagnose infection nor to guide or monitor treatment.     RADIOLOGY STUDIES/RESULTS: Nm Hepatobiliary Liver Func  12/17/2014   CLINICAL DATA:  29 year old male with history of abdominal pain and cholelithiasis noted on recent CT examination.  EXAM: NUCLEAR MEDICINE HEPATOBILIARY IMAGING WITH GALLBLADDER EF  TECHNIQUE: Sequential images of the abdomen were obtained out to 60 minutes following intravenous administration of radiopharmaceutical. After slow intravenous infusion of 1.2 micrograms Cholecystokinin, gallbladder ejection fraction was determined.  RADIOPHARMACEUTICALS:  5 Millicurie GM-01U Choletec  COMPARISON:  No priors.  FINDINGS: Rapid uptake of radiotracer throughout the hepatic parenchyma. Gallbladder activity was  noted by 10 min. At 60 min, most of the activity was within the gallbladder. Following administration of cholecystokinin, activity rapidly entered the small bowel. Gallbladder ejection fraction was 68%. At 45 min, normal ejection fraction is greater than 40%.  IMPRESSION: 1. Normal study.  No findings to suggest acute cholecystitis.   Electronically Signed   By: Vinnie Langton M.D.   On: 12/17/2014 11:45   US Abdomen Complete  12/16/2014   CLINICAL DATA:  29 year old male with a history of abdominal pain for 1 day  EXAM: ULTRASOUND ABDOMEN COMPLETE  COMPARISON:  None.  FINDINGS: Gallbladder: No evidence of gallbladder wall thickening. Negative sonographic Murphy's sign. Heterogeneously echoic material within the neck of the gallbladder with posterior acoustic shadowing.  Common bile duct: Diameter: 2.4 mm  Liver: Relatively homogeneous appearance of liver parenchyma. Focal uniformly hyperechoic lesions identified.  Left lobe:  1.9 cm x 1.4 cm x 1.9 cm, segment 4B.  Left lobe:  1.5 cm x 1.1 cm x 1.2 cm, segment 4B.  IVC: No abnormality visualized.  Pancreas: Visualized portion unremarkable.  Spleen: Size and appearance within normal limits.  Right Kidney: Length: 9.6 cm. Echogenicity within normal limits. No mass or hydronephrosis visualized.  Left Kidney: Length: 9.3 cm. Echogenicity within normal limits. No mass or hydronephrosis visualized.  Abdominal aorta: No aneurysm visualized.  Other findings: None.  IMPRESSION: Evidence of cholelithiasis without sonographic evidence of acute cholecystitis.  There are 2 separate small hyperechoic lesions of the liver. In a patient of this age without risk factors for carcinoma, most likely entity would be benign hemangioma. If the patient has risk factors for development of cirrhosis/HCC, or otherwise a known primary, further evaluation with MRI would be indicated.  Signed,  Dulcy Fanny. Earleen Newport, DO  [facility]  [facility]    Electronically Signed   By: Corrie Mckusick D.O.   On: 12/16/2014 09:46   Ct Abdomen Pelvis W Contrast  12/17/2014   CLINICAL DATA:  Right mid and lower quadrant abdominal pain.  EXAM: CT ABDOMEN AND PELVIS WITH CONTRAST  TECHNIQUE: Multidetector CT imaging of the abdomen and pelvis was performed using the standard protocol following bolus administration of intravenous contrast.  CONTRAST:  150mL OMNIPAQUE IOHEXOL 300 MG/ML  SOLN  COMPARISON:  Right upper quadrant ultrasound performed yesterday.  FINDINGS: The included lung bases are clear.  Two hypodense lesions in the left hepatic lobe measure 1.6 and 1.0 cm with some peripheral puddling of contrast, consistent with hemangiomas previous correspond to the hyperechoic lesion on ultrasound. Gallbladder is physiologically distended, gallstone tentatively identified in the gallbladder neck. No pericholecystic fluid or wall thickening.  The appendix is normal.  The spleen, pancreas, adrenal glands, and kidneys are normal.  Stomach is distended with ingested contrast. There are no dilated or thickened bowel loops. Small volume of stool throughout the colon. No signs of bowel inflammation. No free air, free fluid, or intra-abdominal fluid collection.  Abdominal aorta is normal in caliber. No retroperitoneal or mesenteric adenopathy.  Within the pelvis the bladder is physiologically distended. Prostate gland is normal in size. No inguinal hernia. Scattered bone islands are noted.  No acute or suspicious osseous abnormalities.  IMPRESSION: 1. No acute abnormality in the abdomen/pelvis. 2. Two lesions in the left lobe of the liver, largest measuring 1.6 cm, consistent with hemangiomas, corresponding to that on ultrasound. 3. Cholelithiasis without CT findings of cholecystitis.   Electronically Signed   By: Jeb Levering M.D.   On: 12/17/2014 00:35    Oren Binet, MD  [facility] Pager:[phone]  If 7PM-7AM, please contact  night-coverage [email or web address] Password [email or web address] 12/19/2014, 10:04 AM   LOS: 2 days

## 2014-12-19 NOTE — Op Note (Signed)
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  ETIOLOGY UNKNOWN.  REMOVED AND SENT TO PATHOLOGY.  GALLBLADDER NORMAL IN APPEARANCE.          Joel Tyler   Date: 12/19/2014  Time: 2:39 PM

## 2014-12-20 ENCOUNTER — Encounter (HOSPITAL_COMMUNITY): Payer: Self-pay | Admitting: Surgery

## 2014-12-20 DIAGNOSIS — K654 Sclerosing mesenteritis: Secondary | ICD-10-CM | POA: Diagnosis present

## 2014-12-20 MED ORDER — MUPIROCIN 2 % EX OINT: 1.0000 "application " | TOPICAL_OINTMENT | Freq: Two times a day (BID) | CUTANEOUS | Status: AC

## 2014-12-20 MED ORDER — HYDROCODONE-ACETAMINOPHEN 5-325 MG PO TABS: 1.0000 | ORAL_TABLET | Freq: Four times a day (QID) | ORAL | Status: AC | PRN

## 2014-12-20 NOTE — Op Note (Signed)
Joel Tyler, Joel Tyler               ACCOUNT NO.:  [id number]  MEDICAL RECORD NO.:  [id number]  LOCATION:  [id number]                        FACILITY:  [facility]  PHYSICIAN:  Coralie Keens, M.D. DATE OF BIRTH:  1986-09-27  DATE OF PROCEDURE:  12/19/2014 DATE OF DISCHARGE:                              OPERATIVE REPORT   PREOPERATIVE DIAGNOSIS:  Symptomatic cholelithiasis.  POSTOPERATIVE DIAGNOSIS:  Infarcted omentum.  PROCEDURE: 1. Diagnostic laparoscopy. 2. Excisional biopsy of omental infarction.  SURGEON:  Coralie Keens, MD  ANESTHESIA:  General endotracheal anesthesia and 0.5% Marcaine.  ESTIMATED BLOOD LOSS:  Minimal.  INDICATIONS:  This is a 29 year old gentleman who presented with right upper quadrant abdominal pain and tenderness.  He had an ultrasound showing gallstones.  There was no evidence of cholecystitis.  His pain seemed out of proportion to examination.  A HIDA scan was normal.  He also had a normal CAT scan and normal upper endoscopy.  Decision was made to proceed to the operating room for possible cholecystectomy.  FINDINGS:  The patient was found to have an infarcted piece of omentum that was stuck to the right upper quadrant abdominal wall right at the area of the patient's persistent tenderness and pain.  The gallbladder itself appeared normal, and there was no other intra-abdominal pathology identified.  This area of infarcted omentum was excised and sent to Pathology for evaluation.  PROCEDURE IN DETAIL:  The patient was brought to the operating room, identified as the correct patient.  He was placed supine on the operating room table and general anesthesia was induced.  His abdomen was then prepped and draped in usual sterile fashion.  Using a scalpel, I made a small incision just below the umbilicus.  I carried this down to the fascia which was then opened with scalpel.  A hemostat was used to pass into the peritoneal cavity under direct vision.  A 0  Vicryl pursestring suture was then placed around the fascial opening.  The Hasson port was placed to the abdomen and insufflation of the abdomen was begun.  The camera was then placed into the abdomen.  The patient was found to have a piece of omentum which appeared infarcted and was stuck to the right upper quadrant abdominal wall.  I placed a 5-mm port in the patient's epigastrium and was able to remove this from the abdominal wall.  This was over the area of the patient's tenderness on physical examination.  The gallbladder itself appeared normal, and there was no other visible intra-abdominal pathology.  I placed a 5-mm port in the patient's right upper quadrant and another in left upper quadrant. I was able to grasp this omentum and then excise it with the Harmonic Scalpel.  It was then placed in an endo pouch and removed through the incision at the umbilicus.  The abdomen was then irrigated with normal saline.  Hemostasis appeared to be achieved.  Again, no other intra- abdominal pathology was identified.  Decision was made to leave the gallbladder in place.  I then removed all ports and tied the 0 Vicryl and placed at the umbilicus closing the fascial defect.  All ports were removed under direct vision.  The abdomen  was deflated.  All incisions were anesthetized with Marcaine and closed with 4-0 Monocryl subcuticular sutures.  A skin glue was then applied.  The patient tolerated the procedure well.  All the counts were correct at the end of the procedure.  The patient was then extubated in the operating room and taken in stable condition to recovery room.     Coralie Keens, M.D.     DB/MEDQ  D:  12/19/2014  T:  12/20/2014  Job:  [id number]

## 2014-12-20 NOTE — Discharge Summary (Signed)
  Physician Discharge Summary  Patient ID: Joel Tyler MRN: [id number] DOB/AGE: November 05, 1985 29 y.o.  Admit date: 12/16/2014 Discharge date: 12/20/2014  Admitting Diagnosis: Abdominal pain, RUQ Cholelithiasis   Discharge Diagnosis Patient Active Problem List   Diagnosis Date Noted  . Cholelithiasis 12/17/2014  . Right upper quadrant pain     Consultants GI---Dr. Henrene Pastor IM  Imaging: No results found.  Procedures Diagnostic laparoscopy with omental biopsy---Dr. Ninfa Linden 01/19/15  Hospital Course:  Joel Tyler is a healthy 29 year old male who was admitted under the medicine service with RUQ abdominal pain.  He was found to have gallstones and therefore we were consulted.  HIDA was negative and GI was consulted to endoscopy due to pain out of proportion to laboratory and radiologic studies.  This was negative as well. We then proceed with a laparoscopic cholecystectomy. He was then transferred from the medicine onto our service. He was found to have infarcted omentum which was resected and sent for pathology.  His gallbladder was not removed. He tolerated the procedure well and was transferred to the floor.    Diet was advanced as tolerated.  On POD#1, the patient was voiding well, tolerating diet, ambulating well, pain well controlled, vital signs stable, incisions c/d/i and felt stable for discharge home.  Medication risks, benefits and therapeutic alternatives were reviewed with the patient.  He verbalizes understanding.  Patient will follow up in our office in 2 weeks and knows to call with questions or concerns.  Physical Exam: General:  Alert, NAD, pleasant, comfortable Lungs: CTA. Cardio: S1S2 RRR.  No murmurs, gallops or rubs.  Abd:  Soft, ND, mild tenderness, incisions C/D/I    Medication List    TAKE these medications        HYDROcodone-acetaminophen 5-325 MG per tablet  Commonly known as:  NORCO/VICODIN  Take 1-2 tablets by mouth every 6 (six) hours as needed for  moderate pain.     mupirocin ointment 2 %  Commonly known as:  BACTROBAN  Place 1 application into the nose 2 (two) times daily.     omeprazole 20 MG capsule  Commonly known as:  PRILOSEC  Take 1 capsule (20 mg total) by mouth daily.             Follow-up Information    Follow up with Harl Bowie, MD.   Specialty:  General Surgery   Why:  2-3 week post op check   Contact information:   [place] [address] [phone]       Signed: Erby Pian, [facility] [phone]  12/20/2014, 8:41 AM

## 2014-12-20 NOTE — Progress Notes (Signed)
Patient discharge to home. VSS, A/Ox4. Discharge instructions has been given; as well as patient education about wound care and medications.

## 2014-12-20 NOTE — Discharge Instructions (Signed)

## 2014-12-25 ENCOUNTER — Encounter (HOSPITAL_COMMUNITY): Payer: Self-pay | Admitting: Surgery

## 2015-07-09 IMAGING — NM NM HEPATOBILIARY IMAGE, INC GB
3 series · 13 of 13 positions shown · non-contrast
Comparison: No priors.

CLINICAL DATA: 28-year-old male with history of abdominal pain and
cholelithiasis noted on recent CT examination.

EXAM:
NUCLEAR MEDICINE HEPATOBILIARY IMAGING WITH GALLBLADDER EF
TECHNIQUE: Sequential images of the abdomen were obtained [DATE] minutes
following intravenous administration of radiopharmaceutical. After
slow intravenous infusion of 1.2 micrograms Cholecystokinin,
gallbladder ejection fraction was determined.
RADIOPHARMACEUTICALS:  5 Millicurie Uc-99m Choletec

[he hepatobiliary · 3.43mm/px · 6 of 45 frames shown (1 of 3)]
[frame 4/45]
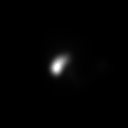
[frame 12/45]
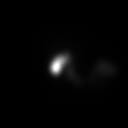
[frame 19/45]
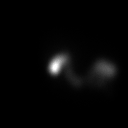
[frame 27/45]
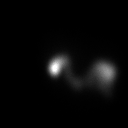
[frame 34/45]
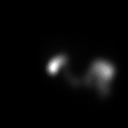
[frame 42/45]
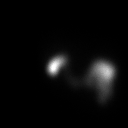

[he hepatobiliary · 3.43mm/px · 6 of 60 frames shown (2 of 3)]
[frame 6/60]
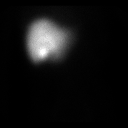
[frame 16/60]
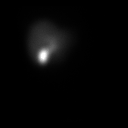
[frame 26/60]
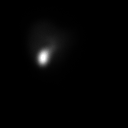
[frame 36/60]
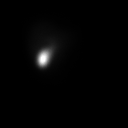
[frame 46/60]
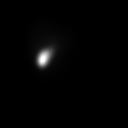
[frame 56/60]
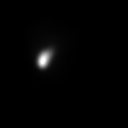

[he hepatobiliary · 1 of 1 slices shown (3 of 3)]
[im 1/1]
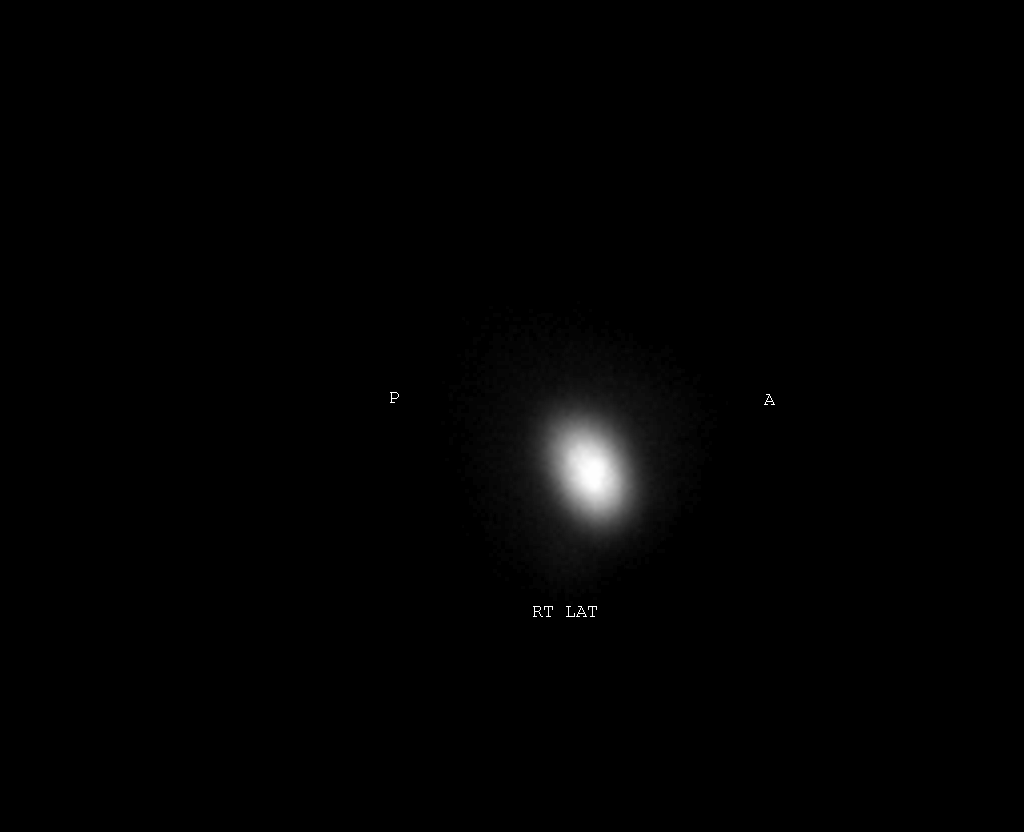

[13 of 13 positions shown; findings below may reference images not displayed]

FINDINGS: Rapid uptake of radiotracer throughout the hepatic parenchyma.
Gallbladder activity was noted by 10 min. At 60 min, most of the
activity was within the gallbladder. Following administration of
cholecystokinin, activity rapidly entered the small bowel.
Gallbladder ejection fraction was 68%.. At 45 min, normal ejection
fraction is greater than 40%.
IMPRESSION: 1. Normal study.  No findings to suggest acute cholecystitis.

## 2015-07-09 IMAGING — CT CT ABD-PELV W/ CM
2 of 4 series · 6 of 46 positions shown, 8 images · IV contrast (omnipaque)
Comparison: Right upper quadrant ultrasound performed yesterday.

CLINICAL DATA: Right mid and lower quadrant abdominal pain.

EXAM:
CT ABDOMEN AND PELVIS WITH CONTRAST
TECHNIQUE: Multidetector CT imaging of the abdomen and pelvis was performed
using the standard protocol following bolus administration of
intravenous contrast.
CONTRAST:  100mL OMNIPAQUE IOHEXOL 300 MG/ML  SOLN

[Series 206: coronals · coronal · 0.50mm/px · 5 of 112 slices shown, 6 images]
[im 13/112  soft-tissue]
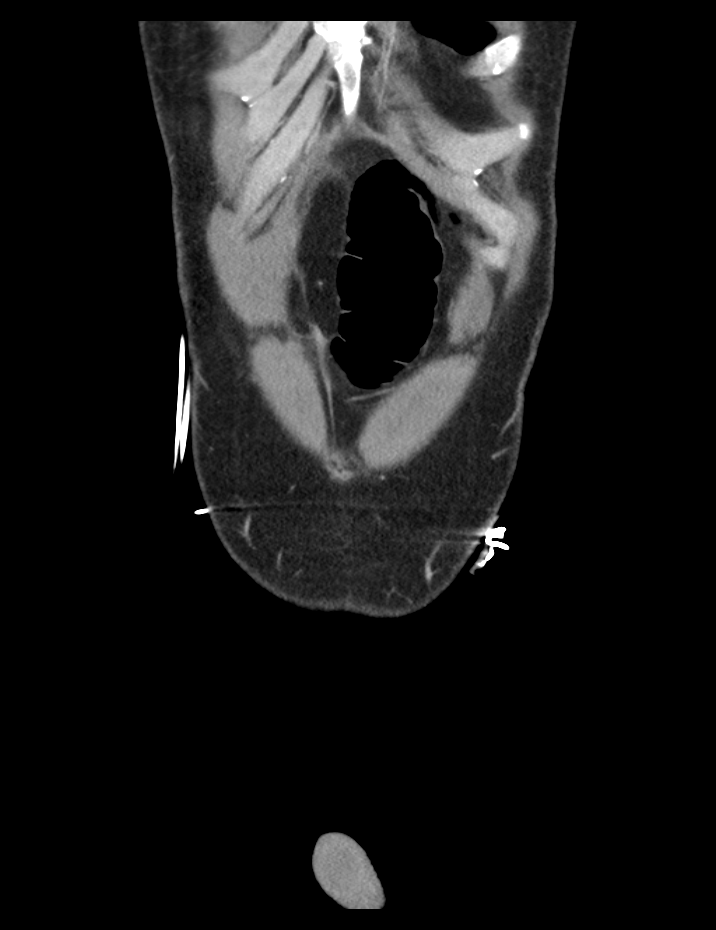
[im 13/112  bone]
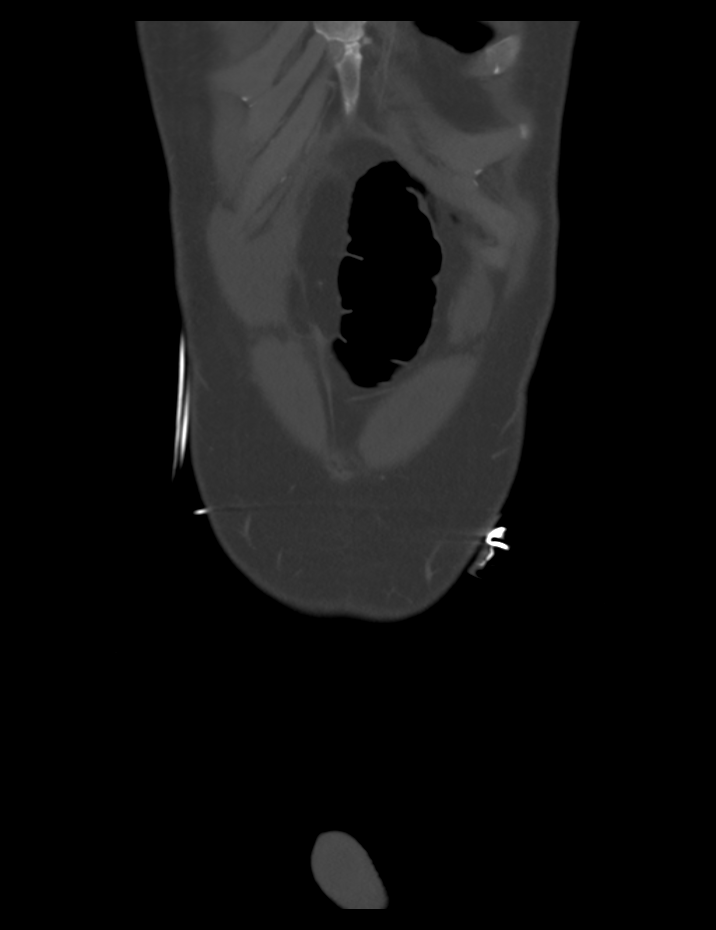
[im 38/112  soft-tissue]
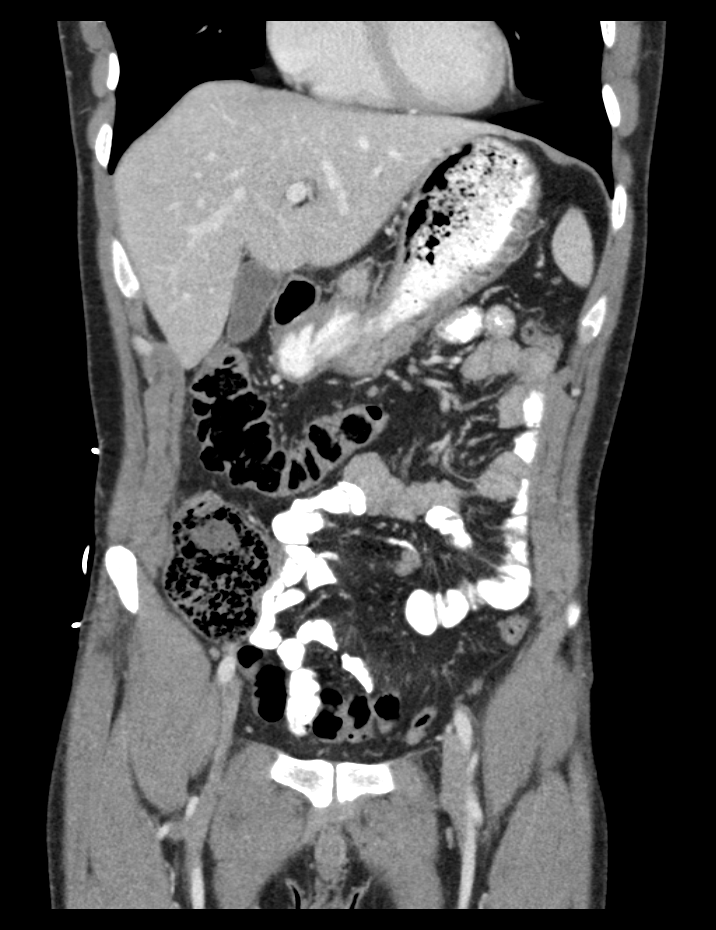
[im 62/112  soft-tissue]
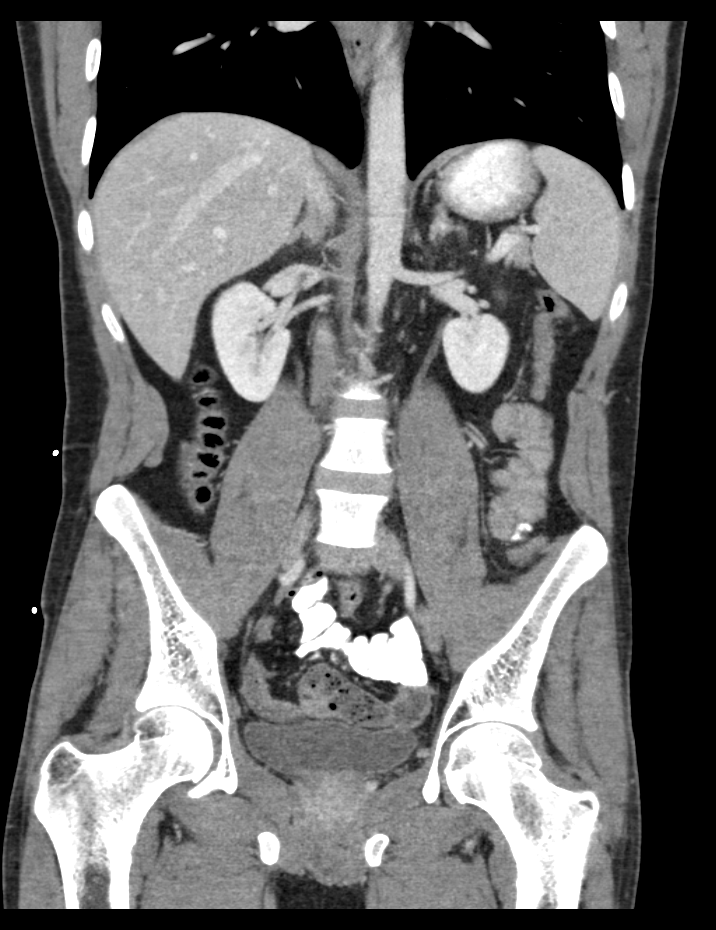
[im 75/112  soft-tissue]
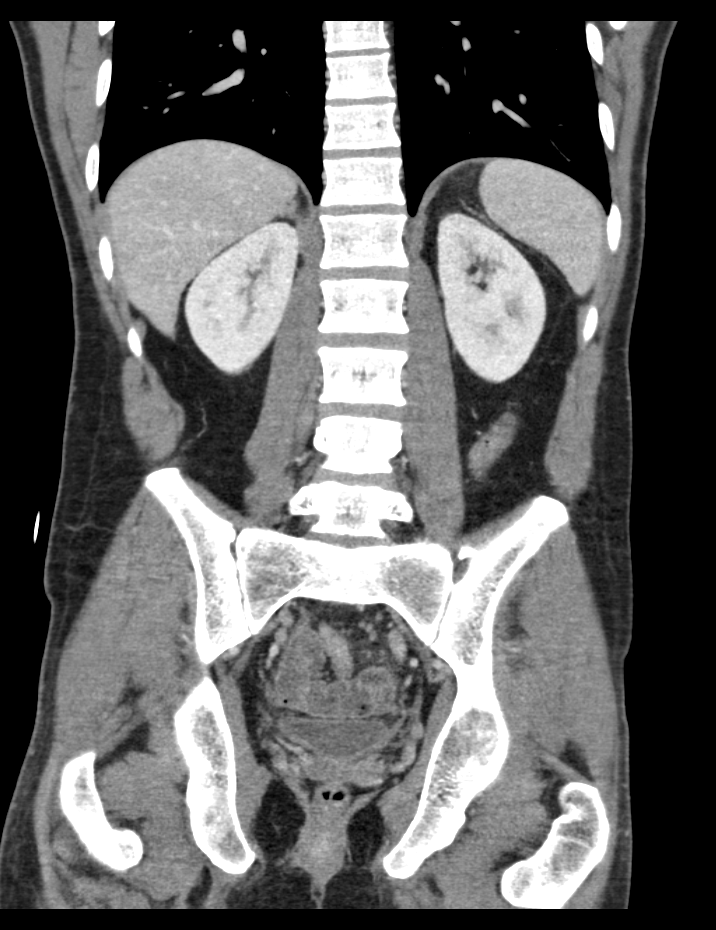
[im 99/112  soft-tissue]
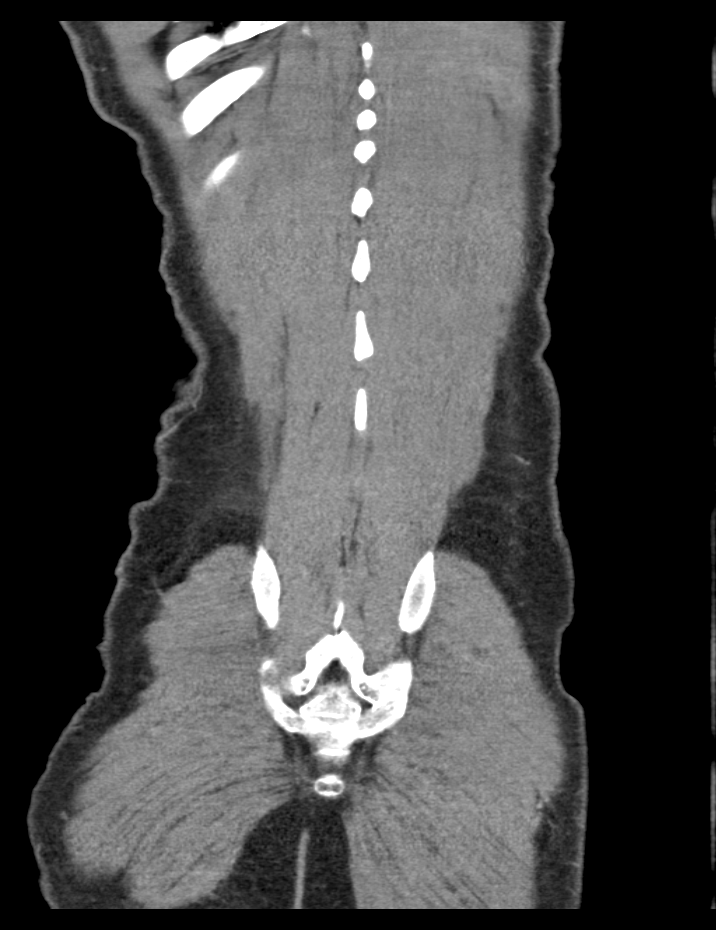

[Series 207: sagittals · sagittal · 0.50mm/px · 1 of 160 slices shown, 2 images]
[im 54/160  soft-tissue]
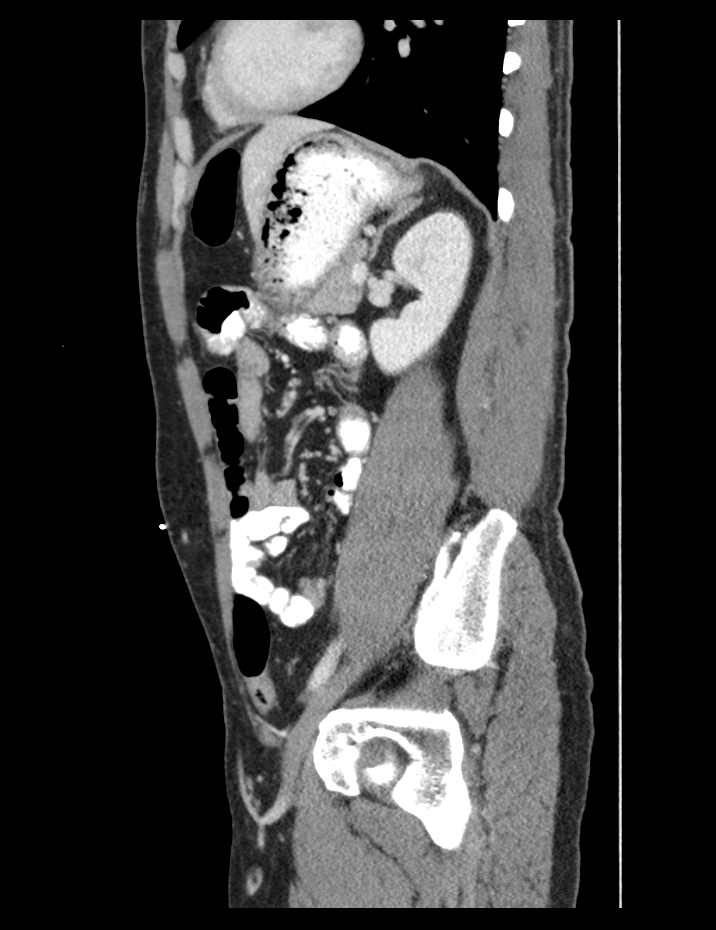
[im 54/160  bone]
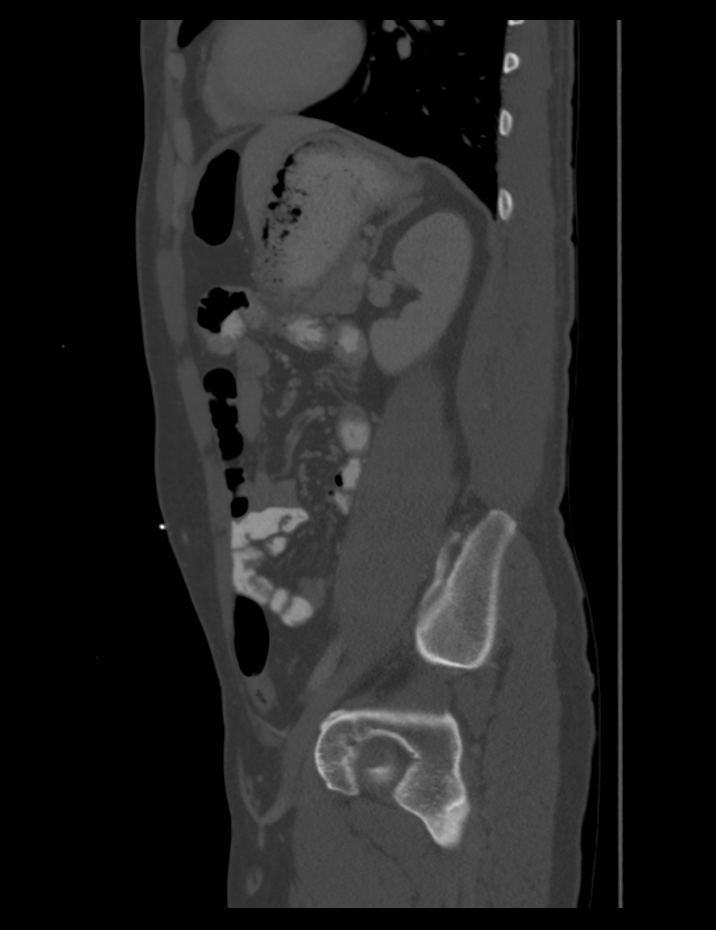

[6 of 46 positions shown; findings below may reference images not displayed]

FINDINGS: The included lung bases are clear.

Two hypodense lesions in the left hepatic lobe measure 1.6 and
cm with some peripheral puddling of contrast, consistent with
hemangiomas previous correspond to the hyperechoic lesion on
ultrasound. Gallbladder is physiologically distended, gallstone
tentatively identified in the gallbladder neck. No pericholecystic
fluid or wall thickening.

The appendix is normal.

The spleen, pancreas, adrenal glands, and kidneys are normal.

Stomach is distended with ingested contrast. There are no dilated or
thickened bowel loops. Small volume of stool throughout the colon.
No signs of bowel inflammation. No free air, free fluid, or
intra-abdominal fluid collection.

Abdominal aorta is normal in caliber. No retroperitoneal or
mesenteric adenopathy.

Within the pelvis the bladder is physiologically distended. Prostate
gland is normal in size. No inguinal hernia. Scattered bone islands
are noted.

No acute or suspicious osseous abnormalities.
IMPRESSION: 1. No acute abnormality in the abdomen/pelvis.
2. Two lesions in the left lobe of the liver, largest measuring
cm, consistent with hemangiomas, corresponding to that on
ultrasound.
3. Cholelithiasis without CT findings of cholecystitis.

## 2016-10-01 ENCOUNTER — Ambulatory Visit (HOSPITAL_COMMUNITY)
Admission: EM | Admit: 2016-10-01 | Discharge: 2016-10-01 | Disposition: A | Discharge status: Home / Self Care | Payer: BLUE CROSS/BLUE SHIELD | Attending: Family Medicine | Admitting: Family Medicine

## 2016-10-01 ENCOUNTER — Encounter (HOSPITAL_COMMUNITY): Payer: Self-pay | Admitting: Emergency Medicine

## 2016-10-01 DIAGNOSIS — K047 Periapical abscess without sinus: Secondary | ICD-10-CM

## 2016-10-01 MED ORDER — IBUPROFEN 800 MG PO TABS: 800.0000 mg | ORAL_TABLET | Freq: Three times a day (TID) | ORAL | 0 refills | Status: AC | PRN

## 2016-10-01 MED ORDER — AMOXICILLIN 875 MG PO TABS: 875.0000 mg | ORAL_TABLET | Freq: Two times a day (BID) | ORAL | 0 refills | Status: AC

## 2016-10-01 MED ORDER — ACETAMINOPHEN 500 MG PO TABS
1000.0000 mg | ORAL_TABLET | Freq: Once | ORAL | Status: AC
  Administered 2016-10-01: 975 mg via ORAL

## 2016-10-01 MED ORDER — ACETAMINOPHEN 325 MG PO TABS
ORAL_TABLET | ORAL | Status: AC
  Filled 2016-10-01: qty 3

## 2016-10-01 NOTE — ED Triage Notes (Signed)
PT reports a fever that started yesterday. Only other complaint is mouth pain.

## 2016-10-01 NOTE — ED Provider Notes (Signed)
CSN: [id number]     Arrival date & time 10/01/16  1225 History   First MD Initiated Contact with Patient 10/01/16 1402     Chief Complaint  Patient presents with  . Fever   (Consider location/radiation/quality/duration/timing/severity/associated sxs/prior Treatment) Patient c/o fever and mouth pain for a day   The history is provided by the patient.  Fever  Temp source:  Subjective Onset quality:  Sudden Duration:  1 day Timing:  Constant Progression:  Worsening Chronicity:  New Relieved by:  None tried Worsened by:  Nothing Associated symptoms: chills     History reviewed. No pertinent past medical history. Past Surgical History:  Procedure Laterality Date  . CHOLECYSTECTOMY N/A 12/19/2014   Procedure: DIAGNOSTIC LAPAROSCOPY WITH OMENTAL BIOPSY;  Surgeon: Coralie Keens, MD;  Location: [place];  Service: General;  Laterality: N/A;  . ESOPHAGOGASTRODUODENOSCOPY N/A 12/18/2014   Procedure: ESOPHAGOGASTRODUODENOSCOPY (EGD);  Surgeon: Irene Shipper, MD;  Location: [facility] ENDOSCOPY;  Service: Endoscopy;  Laterality: N/A;   No family history on file. Social History  Substance Use Topics  . Smoking status: Never Smoker  . Smokeless tobacco: Not on file  . Alcohol use No    Review of Systems  Constitutional: Positive for chills and fever.  HENT: Negative.   Eyes: Negative.   Respiratory: Negative.   Cardiovascular: Negative.   Gastrointestinal: Negative.   Endocrine: Negative.   Genitourinary: Negative.   Musculoskeletal: Negative.   Skin: Negative.   Allergic/Immunologic: Negative.   Neurological: Negative.   Hematological: Negative.     Allergies  Patient has no known allergies.  Home Medications   Prior to Admission medications   Medication Sig Start Date End Date Taking? Authorizing Provider  amoxicillin (AMOXIL) 875 MG tablet Take 1 tablet (875 mg total) by mouth 2 (two) times daily. 10/01/16   Lysbeth Penner, FNP  HYDROcodone-acetaminophen (NORCO/VICODIN)  5-325 MG per tablet Take 1-2 tablets by mouth every 6 (six) hours as needed for moderate pain. 12/20/14   Emina Riebock, NP  ibuprofen (ADVIL,MOTRIN) 800 MG tablet Take 1 tablet (800 mg total) by mouth every 8 (eight) hours as needed. 10/01/16   Lysbeth Penner, FNP  omeprazole (PRILOSEC) 20 MG capsule Take 1 capsule (20 mg total) by mouth daily. 12/16/14   Davonna Belling, MD   Meds Ordered and Administered this Visit   Medications  acetaminophen (TYLENOL) tablet 1,000 mg (975 mg Oral Given 10/01/16 1356)    BP 138/77   Pulse 92   Temp 102.5 F (39.2 C) (Oral)   Resp 16   Wt 132 lb 4.4 oz (60 kg)   SpO2 96%   BMI 20.11 kg/m  No data found.   Physical Exam  Constitutional: He appears well-developed and well-nourished.  HENT:  Head: Normocephalic and atraumatic.  Upper front teeth tender with palpation  Eyes: EOM are normal. Pupils are equal, round, and reactive to light.  Neck: Normal range of motion.  Cardiovascular: Normal rate and regular rhythm.   Pulmonary/Chest: Effort normal and breath sounds normal.  Abdominal: Soft. Bowel sounds are normal.  Nursing note and vitals reviewed.   Urgent Care Course   Clinical Course     Procedures (including critical care time)  Labs Review Labs Reviewed - No data to display  Imaging Review No results found.   Visual Acuity Review  Right Eye Distance:   Left Eye Distance:   Bilateral Distance:    Right Eye Near:   Left Eye Near:    Bilateral Near:  MDM   1. Dental abscess    Amoxicillin 875mg  one po bid x10 days #20  Push po fluids, rest, tylenol and motrin otc prn as directed for fever, arthralgias, and myalgias.  Follow up prn if sx's continue or persist.  Follow up with dentist    Lysbeth Penner, FNP 10/01/16 1527
